# Patient Record
Sex: Female | Born: 1975
Health system: Southern US, Community
[De-identification: ages and names within clinical notes are randomized; demographics above are authoritative.]

## PROBLEM LIST (undated history)

## (undated) DIAGNOSIS — F419 Anxiety disorder, unspecified: Secondary | ICD-10-CM

## (undated) DIAGNOSIS — J45909 Unspecified asthma, uncomplicated: Secondary | ICD-10-CM

## (undated) DIAGNOSIS — O1490 Unspecified pre-eclampsia, unspecified trimester: Secondary | ICD-10-CM

## (undated) DIAGNOSIS — T7840XA Allergy, unspecified, initial encounter: Secondary | ICD-10-CM

## (undated) HISTORY — DX: Allergy, unspecified, initial encounter: T78.40XA

## (undated) HISTORY — DX: Unspecified pre-eclampsia, unspecified trimester: O14.90

## (undated) HISTORY — DX: Unspecified asthma, uncomplicated: J45.909

## (undated) HISTORY — DX: Anxiety disorder, unspecified: F41.9

---
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
## 2011-02-22 LAB — HM PAP SMEAR: HM PAP: NORMAL

## 2012-06-30 LAB — HM HIV SCREENING LAB: HM HIV Screening: NEGATIVE

## 2015-09-03 ENCOUNTER — Ambulatory Visit: Payer: Self-pay | Admitting: Family Medicine

## 2015-09-09 ENCOUNTER — Encounter: Payer: Self-pay | Admitting: Family Medicine

## 2015-09-12 ENCOUNTER — Ambulatory Visit: Payer: Self-pay | Admitting: Family Medicine

## 2015-10-24 ENCOUNTER — Ambulatory Visit: Payer: Self-pay | Admitting: Family Medicine

## 2017-11-10 ENCOUNTER — Encounter: Payer: Self-pay | Admitting: Nurse Practitioner

## 2017-11-10 ENCOUNTER — Ambulatory Visit (INDEPENDENT_AMBULATORY_CARE_PROVIDER_SITE_OTHER): Payer: Commercial Managed Care - PPO | Admitting: Nurse Practitioner

## 2017-11-10 ENCOUNTER — Other Ambulatory Visit: Payer: Self-pay

## 2017-11-10 VITALS — BP 133/80 | HR 92 | Temp 98.4°F | Ht 62.0 in | Wt 143.0 lb

## 2017-11-10 DIAGNOSIS — J0100 Acute maxillary sinusitis, unspecified: Secondary | ICD-10-CM

## 2017-11-10 DIAGNOSIS — Z7689 Persons encountering health services in other specified circumstances: Secondary | ICD-10-CM | POA: Diagnosis not present

## 2017-11-10 DIAGNOSIS — Z23 Encounter for immunization: Secondary | ICD-10-CM | POA: Diagnosis not present

## 2017-11-10 MED ORDER — IPRATROPIUM BROMIDE 0.06 % NA SOLN: 2.0000 | Freq: Four times a day (QID) | NASAL | 0 refills | Status: DC

## 2017-11-10 MED ORDER — AMOXICILLIN 500 MG PO TABS: 500.0000 mg | ORAL_TABLET | Freq: Two times a day (BID) | ORAL | 0 refills | Status: AC

## 2017-11-10 NOTE — Progress Notes (Signed)
Subjective:    Patient ID: Maureen Allen, female    DOB: [birth year], 41 y.o.   MRN: [id number]  Maureen Allen is a 41 y.o. female presenting on 11/10/2017 for Establish Care (nasal congestion, intermittent nasal drainage, sinus pressure mostly under eyes, headaches x 1 mth )   HPI  Establish Care New Provider [facility] [place] [facility].  [facility] for [facility] med refills.  [facility] Con[facility]yl Carroll, Midwife.   URI Pt reports URI symptoms for last 1 month.  SYmptoms include nasal congestion, rhinorhea, sinus congestion/pressure, upper jaw pain, headaches.  She has had chills occasionally as well as occasional left ear pressure.  - Pt denies fever, sweats, nausea, vomiting, diarrhea and constipation. - Nasal antihistamine Afrin uses only at night before bed x 3 days.  Asthma Advair once daily.  Albuterol 2-3x per week.  She has worse symptoms with allergies, but currently feels like her asthma is well controlled.   Past Medical History:  Diagnosis Date  . Allergy    oak leaves fall, blooming  . Anxiety    occasionally has panic attack due to large extra stressor  . Asthma   . Pre-eclampsia    Past Surgical History:  Procedure Laterality Date  . MOUTH SURGERY  1993   Social History   Socioeconomic History  . Marital status: Married    Spouse name: Not on file  . Number of children: Not on file  . Years of education: Not on file  . Highest education level: Not on file  Social Needs  . Financial resource strain: Not on file  . Food insecurity - worry: Not on file  . Food insecurity - inability: Not on file  . Transportation needs - medical: Not on file  . Transportation needs - non-medical: Not on file  Occupational History  . 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 Smoking status: Former Smoker    Last attempt to quit: 11/24/1999    Years since quitting: 18.0  . Smokeless tobacco: Never Used  Substance and Sexual  Activity  . Alcohol use: Yes    Alcohol/week: 0.6 oz    Types: 1 Standard drinks or equivalent per week  . Drug use: No  . Sexual activity: Yes    Birth control/protection: Surgical    Comment: partner vasectomy  Other Topics Concern  . Not on file  Social History Narrative  . Not on file   Family History  Adopted: Yes  Problem Relation Age of Onset  . Healthy Son    Current Outpatient Medications on File Prior to Visit  Medication Sig  . albuterol (PROVENTIL HFA;VENTOLIN HFA) 108 (90 BASE) MCG/ACT inhaler Inhale into the lungs every 6 (six) hours as needed for wheezing or shortness of breath.  . desogestrel-ethinyl estradiol (KARIVA) 0.15-0.02/0.01 MG (21/5) tablet Kariva (28) 0.15 mg-0.02 mg (21)/0.01 mg (5) tablet  TAKE 1 TABLET BY MOUTH ONCE DAILY.  Marland Kitchen. Fluticasone-Salmeterol (ADVAIR) 100-50 MCG/DOSE AEPB Inhale 1 puff into the lungs once.  . loratadine (CLARITIN) 10 MG tablet Take 10 mg by mouth daily.   No current facility-administered medications on file prior to visit.     Review of Systems  Constitutional: Positive for chills.  HENT: Positive for congestion, ear pain, postnasal drip, rhinorrhea, sinus pressure and sinus pain.   Eyes: Negative.   Respiratory: Negative.   Cardiovascular: Negative.   Gastrointestinal: Negative.   Endocrine: Negative.   Genitourinary: Negative.  Musculoskeletal: Negative.   Skin: Negative.   Allergic/Immunologic: Negative.   Neurological: Positive for headaches.  Hematological: Negative.   Psychiatric/Behavioral: Negative.    Per HPI unless specifically indicated above     Objective:    BP 133/80 (BP Location: Right Arm, Patient Position: Sitting, Cuff Size: Normal)   Pulse 92   Temp 98.4 F (36.9 C) (Oral)   Ht 5\' 2"  (1.575 m)   Wt 143 lb (64.9 kg)   LMP 10/11/2017 (Approximate)   BMI 26.16 kg/m   Wt Readings from Last 3 Encounters:  11/10/17 143 lb (64.9 kg)    Physical Exam  General - overweight, well-appearing,  NAD HEENT - Normocephalic, atraumatic, PERRL, EOMI, edematous nares w/ rhinorrhea, oropharynx clear, MMM, TMnormal, Ear canal normal, external ear normal w/o lesions, frontal and maxillary sinuses tender to palpation. Neck - supple, non-tender, no cervical LAD Heart - RRR, no murmurs heard Lungs - Clear throughout all lobes, no wheezing, crackles, or rhonchi. Normal work of breathing. Extremeties - non-tender, no edema, cap refill < 2 seconds, peripheral pulses intact +2 bilaterally Skin - warm, dry, no rashes Neuro - awake, alert, oriented x3, normal gait Psych - Normal mood and affect, normal behavior   Results for orders placed or performed in visit on 09/09/15  HM PAP SMEAR  Result Value Ref Range   HM Pap smear Normal       Assessment & Plan:   Problem List Items Addressed This Visit    None    Visit Diagnoses    Needs flu shot     Pt age < 18.  Desires flu shot today despite symptoms.  Ok to administer.  Place quadrivalent flu vaccine today.   Relevant Orders   Flu Vaccine QUAD 6+ mos PF IM (Fluarix Quad PF) (Completed)   Subacute maxillary sinusitis    -  Primary Consistent with URI and secondary sinusitis with symptoms worsening over the past 7 days and initial symptoms of nasal congestion and sinus pressure over 2 weeks ago.   Plan: 1.START taking amoxicillin 500 mg tablets every 12 hours for 10 days.  Discussed completing antibiotic. - While on antibiotic, take a probiotic OTC or from food. - Start Atrovent nasal spray decongestant 2 sprays each nostril up to 4 times daily for 5-7 days - Continue anti-histamine loratadine 10mg  daily. - Can use Flonase 2 sprays each nostril daily for up to 4-6 weeks if no epistaxis. - Start Mucinex-DM OTC for  7-10 days prn congestion 2. Supportive care with nasal saline, warm herbal tea with honey, 3. Improve hydration 4. Tylenol / Motrin PRN fevers  5. Return criteria given    Encounter to establish care     Previous PCP was at  family doctor in [place].  Records will be requested.  Past medical, family, and surgical history reviewed w/ pt.       Meds ordered this encounter  Medications  . amoxicillin (AMOXIL) 500 MG tablet    Sig: Take 1 tablet (500 mg total) by mouth 2 (two) times daily for 10 days.    Dispense:  20 tablet    Refill:  0    Order Specific Question:   Supervising Provider    Answer:   Smitty Cords [[id number]]  . ipratropium (ATROVENT) 0.06 % nasal spray    Sig: Place 2 sprays into both nostrils 4 (four) times daily for 7 days.    Dispense:  15 mL    Refill:  0  Order Specific Question:   Supervising Provider    Answer:   Smitty CordsKARAMALEGOS, ALEXANDER J [[id number]]     Follow up plan: Return in about 8 months (around 07/11/2018) for annual physical AND as needed if symptoms worsen or do not improve.  Wilhelmina McardleLauren Nyaisha Simao, DNP, AGPCNP-BC Adult Gerontology Primary Care Nurse Practitioner [facility] 11/21/2017, 7:50 PM

## 2017-11-10 NOTE — Patient Instructions (Addendum)
Maureen Allen, Thank you for coming in to clinic today.  1. It sounds like you have an Upper Respiratory Bacterial infection.  Recommend good hand washing. - START taking amoxicillin 500 mg twice daily for 10 days.  Make sure to take all doses of your antibiotic. - While you are on an antibiotic, take a probiotic.  Antibiotics kill good and bad bacteria.  A probiotic helps to replace your good bacteria. Probiotic pills can be found over the counter.  One brand is Florastor, but you can use any brand you prefer.  You can also get good bacteria from foods.  These foods are yogurt, kefir, kombucha, and fresh, refrigerated and uncooked sauerkraut. - Drink plenty of fluids.  - Start Atrovent nasal spray decongestant 2 sprays each nostril up to 4 times daily for 5-7 days - Continue anti-histamine loratadine 10mg  daily. - You may also use Flonase 2 sprays each nostril daily for up to 4-6 weeks, especially for increased ear pressure.  Other over the counter medications you may try, if needed for symptoms are: - If congestion is worse, start OTC Mucinex (or may try Mucinex-DM for cough) up to 7-10 days then stop - You may try over the counter Nasal Saline spray (Simply Saline, Ocean Spray) as needed to reduce congestion. - Start taking Tylenol extra strength 1 to 2 tablets every 6-8 hours for aches or fever/chills for next few days as needed.  Do not take more than 3,000 mg in 24 hours from all medicines.  You may also take ibuprofen 200-400mg  every 8 hours as needed.   - Drink warm herbal tea with honey for sore throat.   If symptoms are significantly worse with persistent fevers/chills despite tylenol/ibpurofen, nausea, vomiting unable to tolerate food/fluids or medicine, body aches, or shortness of breath, sinus pain pressure or worsening productive cough, then follow-up for re-evaluation, may seek more immediate care at Urgent Care or the ED if you are more concerned that it is an emergency.   Please schedule  a follow-up appointment with Maureen Allen, Maureen Allen. Return in about 8 months (around 07/11/2018) for annual physical AND as needed if symptoms worsen or do not improve.  If you have any other questions or concerns, please feel free to call the clinic or send a message through MyChart. You may also schedule an earlier appointment if necessary.  You will receive a survey after today's visit either digitally by e-mail or paper by [employer] mail. Your experiences and feedback matter to us.  Please respond so we know how we are doing as we provide care for you.   Maureen McardleLauren Keierra Nudo, Maureen Allen, Maureen Allen Adult Gerontology Nurse Practitioner [facility], [facility]

## 2017-11-21 ENCOUNTER — Encounter: Payer: Self-pay | Admitting: Nurse Practitioner

## 2017-11-30 ENCOUNTER — Encounter: Payer: Self-pay | Admitting: Nurse Practitioner

## 2017-11-30 ENCOUNTER — Other Ambulatory Visit: Payer: Self-pay

## 2017-11-30 ENCOUNTER — Ambulatory Visit (INDEPENDENT_AMBULATORY_CARE_PROVIDER_SITE_OTHER): Payer: Commercial Managed Care - PPO | Admitting: Nurse Practitioner

## 2017-11-30 VITALS — BP 122/74 | HR 90 | Temp 98.5°F | Resp 18 | Ht 62.0 in | Wt 143.0 lb

## 2017-11-30 DIAGNOSIS — J0141 Acute recurrent pansinusitis: Secondary | ICD-10-CM | POA: Diagnosis not present

## 2017-11-30 MED ORDER — AMOXICILLIN-POT CLAVULANATE ER 1000-62.5 MG PO TB12: 2.0000 | ORAL_TABLET | Freq: Two times a day (BID) | ORAL | 0 refills | Status: AC

## 2017-11-30 NOTE — Progress Notes (Signed)
Subjective:    Patient ID: Maureen Allen, female    DOB: [birth year], 42 y.o.   MRN: [id number]  Maureen Allen is a 42 y.o. female presenting on 11/30/2017 for Recurrent Sinusitis (Sinus pressure above the eyes, mild cough and runny nose w/ yellowish nasal drainage. pt states the symptoms improved some, but return after she stop abx. )   HPI Sinusitis Treated on 11/10/17 - Initial improvement after 3-7 days, but is having symptoms return several days after initial treatment.  She finished antibiotics on Friday and had symptoms return on Wednesday.  She has had sinus pressure, cough, runny nose and yellow mucus again.  Occasional chills, but no other symptoms. - She is still taking Claritin and Mucinex.  Social History   Tobacco Use  . Smoking status: Former Smoker    Last attempt to quit: 11/24/1999    Years since quitting: 18.0  . Smokeless tobacco: Never Used  Substance Use Topics  . Alcohol use: Yes    Alcohol/week: 0.6 oz    Types: 1 Standard drinks or equivalent per week  . Drug use: No    Review of Systems Per HPI unless specifically indicated above     Objective:    BP 122/74 (BP Location: Right Arm, Patient Position: Sitting, Cuff Size: Normal)   Pulse 90   Temp 98.5 F (36.9 C) (Oral)   Resp 18   Ht 5\' 2"  (1.575 m)   Wt 143 lb (64.9 kg)   BMI 26.16 kg/m   Wt Readings from Last 3 Encounters:  11/30/17 143 lb (64.9 kg)  11/10/17 143 lb (64.9 kg)    Physical Exam  General - overweight, well-appearing, NAD HEENT - Normocephalic, atraumatic, PERRL, EOMI, edematous nares w/ rhinorrhea, oropharynx clear, MMM, TMnormal, Ear canal normal, external ear normal w/o lesions, maxillary sinuses tender to palpation. Frontal sinus nontender. Neck - supple, non-tender, no cervical LAD Heart - RRR, no murmurs heard Lungs - Clear throughout all lobes, no wheezing, crackles, or rhonchi. Normal work of breathing. Extremeties - non-tender, no edema, cap refill < 2 seconds, peripheral pulses  intact +2 bilaterally Skin - warm, dry, no rashes Neuro - awake, alert, oriented x3, normal gait Psych - Normal mood and affect, normal behavior       Assessment & Plan:   Problem List Items Addressed This Visit    None    Visit Diagnoses    Acute recurrent pansinusitis    -  Primary   Relevant Medications   amoxicillin-clavulanate (AUGMENTIN XR) 1000-62.5 MG 12 hr tablet    .START taking Augmentin XR 1,000-62.5 mg two tablets 12 hours for 10 days.  Discussed completing antibiotic. - While on antibiotic, take a probiotic OTC or from food. - Start Atrovent nasal spray decongestant 2 sprays each nostril up to 4 times daily for 5-7 days - Continue anti-histamine loratadine 10mg  daily. - Can use Flonase 2 sprays each nostril daily for up to 4-6 weeks if no epistaxis. - Start Mucinex-DM OTC for  7-10 days prn congestion 2. Supportive care with nasal saline, warm herbal tea with honey, 3. Improve hydration 4. Tylenol / Motrin PRN fevers  5. Return criteria given  Meds ordered this encounter  Medications  . amoxicillin-clavulanate (AUGMENTIN XR) 1000-62.5 MG 12 hr tablet    Sig: Take 2 tablets by mouth 2 (two) times daily for 10 days.    Dispense:  40 tablet    Refill:  0    Order Specific Question:   Supervising Provider  Answer:   Smitty CordsKARAMALEGOS, ALEXANDER J [[id number]]      Follow up plan: Return 5-7 days if symptoms worsen or fail to improve.   Maureen McardleLauren Chick Cousins, DNP, AGPCNP-BC Adult Gerontology Primary Care Nurse Practitioner [facility] 11/30/2017, 5:05 PM

## 2017-11-30 NOTE — Patient Instructions (Addendum)
Maureen Allen, Thank you for coming in to clinic today.  1. Take Augmentin XR 1000 mg two tablets twice daily for 10 days.  2. If you feel like it is still hanging on before done with antibiotics, call clinic for prednisone.  Continue OTC meds as previously instructed and use backup birth control method.  Please schedule a follow-up appointment with Wilhelmina McardleLauren Rache Klimaszewski, AGNP. Return 5-7 days if symptoms worsen or fail to improve.  If you have any other questions or concerns, please feel free to call the clinic or send a message through MyChart. You may also schedule an earlier appointment if necessary.  You will receive a survey after today's visit either digitally by e-mail or paper by [employer] mail. Your experiences and feedback matter to us.  Please respond so we know how we are doing as we provide care for you.   Wilhelmina McardleLauren Rykker Coviello, DNP, AGNP-BC Adult Gerontology Nurse Practitioner [facility], [facility]

## 2018-01-17 ENCOUNTER — Encounter: Payer: Self-pay | Admitting: Nurse Practitioner

## 2018-01-17 ENCOUNTER — Other Ambulatory Visit: Payer: Self-pay

## 2018-01-17 ENCOUNTER — Ambulatory Visit (INDEPENDENT_AMBULATORY_CARE_PROVIDER_SITE_OTHER): Payer: Commercial Managed Care - PPO | Admitting: Nurse Practitioner

## 2018-01-17 VITALS — BP 144/82 | HR 99 | Ht 62.0 in | Wt 141.2 lb

## 2018-01-17 DIAGNOSIS — F419 Anxiety disorder, unspecified: Secondary | ICD-10-CM

## 2018-01-17 DIAGNOSIS — F4321 Adjustment disorder with depressed mood: Secondary | ICD-10-CM

## 2018-01-17 MED ORDER — PAROXETINE HCL 20 MG PO TABS: ORAL_TABLET | ORAL | 5 refills | Status: DC

## 2018-01-17 MED ORDER — HYDROXYZINE HCL 25 MG PO TABS: ORAL_TABLET | ORAL | 1 refills | Status: DC

## 2018-01-17 NOTE — Patient Instructions (Addendum)
Maureen Allen, Thank you for coming in to clinic today.  1. Start hydralazine 25 mg tablet .  Take 1/2 - 1 tablet during day for anxiety.  Take 1-2 tablets at beditme for anxiety and sleep.  2. Start paroxetine 20 mg once daily for long-term anxiety control.  Please schedule a follow-up appointment with Wilhelmina McardleLauren Binnie Vonderhaar, AGNP. Return in about 6 weeks (around 02/28/2018) for anxiety.  If you have any other questions or concerns, please feel free to call the clinic or send a message through MyChart. You may also schedule an earlier appointment if necessary.  You will receive a survey after today's visit either digitally by e-mail or paper by [employer] mail. Your experiences and feedback matter to us.  Please respond so we know how we are doing as we provide care for you.   Wilhelmina McardleLauren Deneen Slager, DNP, AGNP-BC Adult Gerontology Nurse Practitioner [facility], [facility]

## 2018-01-17 NOTE — Progress Notes (Signed)
Subjective:    Patient ID: Maureen Allen, female    DOB: [birth year], 42 y.o.   MRN: [id number]  Maureen Lamasnna Kraemer is a 42 y.o. female presenting on 01/17/2018 for Anxiety (pt recently had deaf in her family. Her mother passed away x 5 days ago. )   HPI Grief/Anxiety Pt presents today to obtain pharmacologic treatment of increased anxiety and overwhelming sadness.  Pt has not yet had panic attack, but does still have overwhelming waves of emotions of sadness, anxiety after her mother's death 5 days ago. Has fear of anxiety attack when she is with her son or when she returns to work after her mother's passing.  She reports taking a 1/2 Xanax from a family member during the funeral.  She cites this was not healthy practice, but felt she had no other choice at that time.  Pt is an only child and has already had her father die.  She reports having great extended family support and support from her husband and child. - Pt also reports significant insomnia with difficulty going to sleep, awakening in middle of the night.  - Pt also has questions about how to dispose safely of medications her mother was taking. Med list reviewed.  Instructed pt to take valium to any law enforcement office for drug take back.  Most pharmacies may also do any drug take back for safe disposal.     Dep[facility] screen [facility] 01/17/2018 11/10/2017  Decreased Interest 0 0  Down, Depressed, Hopeless 0 0  PHQ - 2 Score 0 0  Altered sleeping 1 0  Tired, decreased energy 0 1  Change in appetite 1 0  Feeling bad or failure about yourself  0 0  Trouble concentrating 1 0  Moving slowly or fidgety/restless 1 0  Suicidal thoughts 0 0  PHQ-9 Score 4 1  Difficult doing work/chores Very difficult Not difficult at all   GAD 7 : Generalized Anxiety Score 01/17/2018 11/10/2017  Nervous, Anxious, on Edge 1 0  Control/stop worrying 1 0  Worry too much - different things 1 0  Trouble relaxing 1 0  Restless 1 0  Easily annoyed or irritable 1 1    Afraid - awful might happen 1 0  Total GAD 7 Score 7 1  Anxiety Difficulty Extremely difficult Not difficult at all    Social History   Tobacco Use  . Smoking status: Former Smoker    Last attempt to quit: 11/24/1999    Years since quitting: 18.1  . Smokeless tobacco: Never Used  Substance Use Topics  . Alcohol use: Yes    Alcohol/week: 0.6 oz    Types: 1 Standard drinks or equivalent per week  . Drug use: No    Review of Systems Per HPI unless specifically indicated above     Objective:    BP (!) 140/101 (BP Location: Right Arm, Patient Position: Sitting, Cuff Size: Normal)   Pulse 99   Ht 5\' 2"  (1.575 m)   Wt 141 lb 3.2 oz (64 kg)   BMI 25.83 kg/m    BP recheck: 144/82  Wt Readings from Last 3 Encounters:  01/17/18 141 lb 3.2 oz (64 kg)  11/30/17 143 lb (64.9 kg)  11/10/17 143 lb (64.9 kg)    Physical Exam  General - healthy weight, well-appearing, NAD HEENT - Normocephalic, atraumatic Neck - supple, non-tender, no LAD, no thyromegaly, no carotid bruit Heart - RRR, no murmurs heard Lungs - Clear throughout all lobes, no wheezing, crackles, or  rhonchi. Normal work of breathing. Extremeties - non-tender, no edema, cap refill < 2 seconds, peripheral pulses intact +2 bilaterally Skin - warm, dry Neuro - awake, alert, oriented x3, normal gait Psych - Normal mood and affect at times.  Often tearful, somewhat anxious mood in response to grief and memories. Otherwise normal behavior and is participating well in conversation.  Results for orders placed or performed in visit on 09/09/15  HM PAP SMEAR  Result Value Ref Range   HM Pap smear Normal       Assessment & Plan:   Problem List Items Addressed This Visit    None    Visit Diagnoses    Grief    -  Primary   Relevant Medications   hydrOXYzine (ATARAX/VISTARIL) 25 MG tablet   PARoxetine (PAXIL) 20 MG tablet   Anxiety       Relevant Medications   hydrOXYzine (ATARAX/VISTARIL) 25 MG tablet   PARoxetine  (PAXIL) 20 MG tablet      Currently healthy grief process, but pt having difficulty with symptoms of anxiety and insomnia.  Pt with good support system and healthy response to date.  Denies SI/HI and has no plans to carry out.  Plan: 1. Discussed to continue relying on social support and family support. 2. START hydroxyzine 25 mg tablet.  Take 1/2-1 tablet twice daily as needed for anxiety.  Take 1-2 tablets as needed at bedtime for anxiety and sleep. 3. START paroxetine 20 mg once daily for anxiety.  Take 1/2 tablet for 7 days, then increase to 1 tablet daily.  Reviewed common GI side effects and possiblity for decreased libido. 4. Followup 6 weeks.  Meds ordered this encounter  Medications  . hydrOXYzine (ATARAX/VISTARIL) 25 MG tablet    Sig: Take 1/2 - 1 tablet twice during day as needed for anxiety and take 1-2 tablets at bedtime for anxiety and sleep.    Dispense:  120 tablet    Refill:  1    Order Specific Question:   Supervising Provider    Answer:   Smitty Cords [[id number]]  . PARoxetine (PAXIL) 20 MG tablet    Sig: For first 7 days, take 1/2 (10 mg) tablet once daily.  Then, take 1 full tablet (20 mg) and continue.    Dispense:  30 tablet    Refill:  5    Order Specific Question:   Supervising Provider    Answer:   Smitty Cords [[id number]]    Follow up plan: Return in about 6 weeks (around 02/28/2018) for anxiety.  Wilhelmina Mcardle, DNP, AGPCNP-BC Adult Gerontology Primary Care Nurse Practitioner [facility] 01/17/2018, 10:44 AM

## 2018-02-09 ENCOUNTER — Telehealth: Payer: Self-pay

## 2018-02-09 ENCOUNTER — Other Ambulatory Visit: Payer: Self-pay

## 2018-02-09 DIAGNOSIS — F4321 Adjustment disorder with depressed mood: Secondary | ICD-10-CM

## 2018-02-09 DIAGNOSIS — F419 Anxiety disorder, unspecified: Secondary | ICD-10-CM

## 2018-02-09 MED ORDER — PAROXETINE HCL 20 MG PO TABS: ORAL_TABLET | ORAL | 1 refills | Status: DC

## 2018-02-09 MED ORDER — HYDROXYZINE HCL 25 MG PO TABS: ORAL_TABLET | ORAL | 1 refills | Status: DC

## 2018-02-09 NOTE — Telephone Encounter (Signed)
90 Days Supply, request for authorization.

## 2018-02-17 NOTE — Telephone Encounter (Signed)
error 

## 2018-02-28 ENCOUNTER — Encounter: Payer: Self-pay | Admitting: Nurse Practitioner

## 2018-02-28 ENCOUNTER — Other Ambulatory Visit: Payer: Self-pay

## 2018-02-28 ENCOUNTER — Ambulatory Visit (INDEPENDENT_AMBULATORY_CARE_PROVIDER_SITE_OTHER): Payer: Commercial Managed Care - PPO | Admitting: Nurse Practitioner

## 2018-02-28 VITALS — BP 122/74 | HR 81 | Temp 98.6°F | Ht 62.0 in | Wt 140.2 lb

## 2018-02-28 DIAGNOSIS — F419 Anxiety disorder, unspecified: Secondary | ICD-10-CM

## 2018-02-28 DIAGNOSIS — F4321 Adjustment disorder with depressed mood: Secondary | ICD-10-CM | POA: Diagnosis not present

## 2018-02-28 NOTE — Patient Instructions (Addendum)
Maureen Allen,   Thank you for coming in to clinic today.  1. Continue medication without changes.    Consider Grief counseling: - [facility] [place] [facility] [place]  [facility] [address]  [place], [place]  [id number]   General Information Phone: [phone]; Toll-free: [phone]  Please schedule a follow-up appointment with Wilhelmina McardleLauren Khyre Germond, AGNP. Return in about 6 weeks (around 04/11/2018) for anxiety and grief.   If you have any other questions or concerns, please feel free to call the clinic or send a message through MyChart. You may also schedule an earlier appointment if necessary.  You will receive a survey after today's visit either digitally by e-mail or paper by [employer] mail. Your experiences and feedback matter to us.  Please respond so we know how we are doing as we provide care for you.  Wilhelmina McardleLauren Merian Wroe, DNP, AGNP-BC Adult Gerontology Nurse Practitioner [facility], [facility]

## 2018-02-28 NOTE — Progress Notes (Signed)
Subjective:    Patient ID: Maureen Allen, female    DOB: [birth year], 42 y.o.   MRN: [id number]  Maureen Allen is a 42 y.o. female presenting on 02/28/2018 for Anxiety   HPI Anxiety End of the day, activities at home are now most stressful and hardest still.  Particularly, the chaos of getting dinner prepared and 71-year-old to sleep.  She notes "no patience" at end of the day.  - AM and readiness to go is still going well. - Having some trouble falling asleep.  Nighttime mind still wanders at bedtime. - Sometimes is more in tense muscles  - had raw cheeks and tongue initially from grinding/irritation/muscle clenching, which have improved. - Most bothersome task related to grieving/loss of her mother at this point are executor of estate stressors.   - Continues to have lots of social support from husband and her mother's extended family.   GAD 7 : Generalized Anxiety Score 02/28/2018 01/17/2018 11/10/2017  Nervous, Anxious, on Edge 2 1 0  Control/stop worrying 1 1 0  Worry too much - different things 1 1 0  Trouble relaxing 0 1 0  Restless 0 1 0  Easily annoyed or irritable 2 1 1   Afraid - awful might happen 1 1 0  Total GAD 7 Score 7 7 1   Anxiety Difficulty Somewhat difficult Extremely difficult Not difficult at all   Depression screen [facility] 2/9 02/28/2018 01/17/2018 11/10/2017  Decreased Interest 1 0 0  Down, Depressed, Hopeless 0 0 0  PHQ - 2 Score 1 0 0  Altered sleeping 0 1 0  Tired, decreased energy 0 0 1  Change in appetite 0 1 0  Feeling bad or failure about yourself  0 0 0  Trouble concentrating 1 1 0  Moving slowly or fidgety/restless 0 1 0  Suicidal thoughts 0 0 0  PHQ-9 Score 2 4 1   Difficult doing work/chores Somewhat difficult Very difficult Not difficult at all     Social History   Tobacco Use  . Smoking status: Former Smoker    Last attempt to quit: 11/24/1999    Years since quitting: 18.3  . Smokeless tobacco: Never Used  Substance Use Topics  . Alcohol use: Yes   Alcohol/week: 0.6 oz    Types: 1 Standard drinks or equivalent per week  . Drug use: No    Review of Systems Per HPI unless specifically indicated above     Objective:    BP 122/74 (BP Location: Right Arm, Patient Position: Sitting, Cuff Size: Normal)   Pulse 81   Temp 98.6 F (37 C) (Oral)   Ht 5\' 2"  (1.575 m)   Wt 140 lb 3.2 oz (63.6 kg)   BMI 25.64 kg/m   Wt Readings from Last 3 Encounters:  02/28/18 140 lb 3.2 oz (63.6 kg)  01/17/18 141 lb 3.2 oz (64 kg)  11/30/17 143 lb (64.9 kg)    Physical Exam  Constitutional: She is oriented to person, place, and time. She appears well-developed and well-nourished. No distress.  HENT:  Head: Normocephalic and atraumatic.  Cardiovascular: Normal rate, regular rhythm, S1 normal, S2 normal, normal heart sounds and intact distal pulses.  Pulmonary/Chest: Effort normal and breath sounds normal. No respiratory distress.  Neurological: She is alert and oriented to person, place, and time.  Skin: Skin is warm and dry.  Psychiatric: She has a normal mood and affect. Her behavior is normal. Judgment and thought content normal.  Vitals reviewed.  Results for orders placed or  performed in visit on 09/09/15  HM PAP SMEAR  Result Value Ref Range   HM Pap smear Normal       Assessment & Plan:   Problem List Items Addressed This Visit      Other   Anxiety - Primary    Other Visit Diagnoses    Grief        Pt continues to have anxiety and occasional sadness for grief process of losing her mother.  She is improving on medications hydroxyzine and paroxetine.  Is not noticing any significant side effects.  Plan: 1. Continue medication without changes.   2. Consider Grief counseling Provided information for [facility] [place] [facility] [place]  3. Followup 6 weeks  Follow up plan: Return in about 6 weeks (around 04/11/2018) for anxiety and grief.  Wilhelmina McardleLauren Emrick Hensch, DNP, AGPCNP-BC Adult Gerontology Primary Care Nurse  Practitioner [facility] 03/23/2018, 9:01 PM

## 2018-03-23 ENCOUNTER — Encounter: Payer: Self-pay | Admitting: Nurse Practitioner

## 2018-03-23 DIAGNOSIS — F419 Anxiety disorder, unspecified: Secondary | ICD-10-CM | POA: Insufficient documentation

## 2018-05-25 ENCOUNTER — Telehealth: Payer: Self-pay | Admitting: Nurse Practitioner

## 2018-05-25 DIAGNOSIS — J4520 Mild intermittent asthma, uncomplicated: Secondary | ICD-10-CM

## 2018-05-25 MED ORDER — ALBUTEROL SULFATE HFA 108 (90 BASE) MCG/ACT IN AERS: 1.0000 | INHALATION_SPRAY | Freq: Four times a day (QID) | RESPIRATORY_TRACT | 2 refills | Status: DC | PRN

## 2018-05-25 NOTE — Telephone Encounter (Signed)
Pt called requesting refill on [employer]  [facility]  called into  [facility]  [address]   [place]

## 2018-08-10 ENCOUNTER — Other Ambulatory Visit: Payer: Self-pay | Admitting: Nurse Practitioner

## 2018-08-10 DIAGNOSIS — F419 Anxiety disorder, unspecified: Secondary | ICD-10-CM

## 2018-08-10 DIAGNOSIS — F4321 Adjustment disorder with depressed mood: Secondary | ICD-10-CM

## 2018-08-11 ENCOUNTER — Other Ambulatory Visit: Payer: Self-pay | Admitting: Nurse Practitioner

## 2018-08-11 DIAGNOSIS — J4520 Mild intermittent asthma, uncomplicated: Secondary | ICD-10-CM

## 2018-09-20 ENCOUNTER — Other Ambulatory Visit: Payer: Self-pay

## 2018-09-20 ENCOUNTER — Encounter: Payer: Self-pay | Admitting: Nurse Practitioner

## 2018-09-20 ENCOUNTER — Ambulatory Visit (INDEPENDENT_AMBULATORY_CARE_PROVIDER_SITE_OTHER): Payer: Commercial Managed Care - PPO | Admitting: Nurse Practitioner

## 2018-09-20 VITALS — BP 132/80 | HR 72 | Temp 98.3°F | Ht 62.0 in | Wt 140.8 lb

## 2018-09-20 DIAGNOSIS — Z23 Encounter for immunization: Secondary | ICD-10-CM

## 2018-09-20 DIAGNOSIS — Z1239 Encounter for other screening for malignant neoplasm of breast: Secondary | ICD-10-CM | POA: Diagnosis not present

## 2018-09-20 DIAGNOSIS — Z0000 Encounter for general adult medical examination without abnormal findings: Secondary | ICD-10-CM

## 2018-09-20 NOTE — Patient Instructions (Addendum)
Maureen Allen,   Thank you for coming in to clinic today.  1. Keep up your work for healthy eating and regular activity.  2. Your mammogram order has been placed.  Call the Scheduling phone number at [phone] to schedule your mammogram at your convenience.  You can choose to go to either location listed below.  Let the scheduler know which location you prefer.  [facility]  [facility]  [address]  [place], [place] [address]   [facility] [address] [place], [place] [address]   3. Keep your GYN appointment for your Pap smear.  Please schedule a follow-up appointment with Wilhelmina Mcardle, AGNP. Return in about 1 year (around 09/21/2019) for annual physical.  If you have any other questions or concerns, please feel free to call the clinic or send a message through MyChart. You may also schedule an earlier appointment if necessary.  You will receive a survey after today's visit either digitally by e-mail or paper by [employer]. Your experiences and feedback matter to [place].  Please respond so we know how we are doing as we provide care for you.   Wilhelmina Mcardle, DNP, AGNP-BC Adult Gerontology Nurse Practitioner [facility], [facility]

## 2018-09-20 NOTE — Progress Notes (Signed)
Subjective:    Patient ID: Maureen Allen, female    DOB: 1976-05-25, 42 y.o.   MRN: [id number]  Maureen Allen is a 42 y.o. female presenting on 09/20/2018 for Annual Exam   HPI Annual Physical Exam Patient has been feeling well.  They have no acute concerns today. Sleeps approx 7 hours per night uninterrupted.  Continues to do well with mother's death on Paxil.  Also has had testicular cancer diagnosis in her husband this past summer, but with good outcome.  HEALTH MAINTENANCE: Weight/BMI: stable Physical activity: not regularly, but is starting doing ballet/stretching and hiking Diet: working on improving, has been eating more meals out/fried food. Seatbelt: always Sunscreen: sometimes, more on face with cosmetics PAP: due - will have done at GYN Mammogram:  due HIV: neg with prior pregnancy Optometry: regular Dentistry: regular  VACCINES: Tetanus: due today Influenza: due today  Past Medical History:  Diagnosis Date  . Allergy    oak leaves fall, blooming  . Anxiety    occasionally has panic attack due to large extra stressor  . Asthma   . Pre-eclampsia    Past Surgical History:  Procedure Laterality Date  . MOUTH SURGERY  1993   Social History   Socioeconomic History  . Marital status: Married    Spouse name: Quianna Avery  . Number of children: Not on file  . Years of education: Not on file  . Highest education level: Not on file  Occupational History  . 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 Financial resource strain: Not on file  . Food insecurity:    Worry: Not on file    Inability: Not on file  . Transportation needs:    Medical: Not on file    Non-medical: Not on file  Tobacco Use  . Smoking status: Former Smoker    Last attempt to quit: 11/24/1999    Years since quitting: 18.8  . Smokeless tobacco: Never Used  Substance and Sexual Activity  . Alcohol use: Yes    Alcohol/week: 1.0 standard drinks    Types: 1 Standard drinks or equivalent  per week  . Drug use: No  . Sexual activity: Yes    Birth control/protection: Surgical    Comment: partner vasectomy  Lifestyle  . Physical activity:    Days per week: Not on file    Minutes per session: Not on file  . Stress: Not on file  Relationships  . Social connections:    Talks on phone: Not on file    Gets together: Not on file    Attends religious service: Not on file    Active member of club or organization: Not on file    Attends meetings of clubs or organizations: Not on file    Relationship status: Not on file  . Intimate partner violence:    Fear of current or ex partner: Not on file    Emotionally abused: Not on file    Physically abused: Not on file    Forced sexual activity: Not on file  Other Topics Concern  . Not on file  Social History Narrative  . Not on file   Family History  Adopted: Yes  Problem Relation Age of Onset  . Healthy Son    Current Outpatient Medications on File Prior to Visit  Medication Sig  . albuterol (PROVENTIL HFA;VENTOLIN HFA) 108 (90 Base) MCG/ACT inhaler INHALE 1-2 PUFFS INTO THE LUNGS EVERY 6 (SIX) HOURS AS NEEDED FOR WHEEZING OR SHORTNESS  OF BREATH.  Marland Kitchen Fluticasone-Salmeterol (ADVAIR) 100-50 MCG/DOSE AEPB Inhale 1 puff into the lungs once.  . loratadine (CLARITIN) 10 MG tablet Take 10 mg by mouth daily.  Marland Kitchen PARoxetine (PAXIL) 20 MG tablet FOR FIRST 7 DAYS, TAKE 1/2 (10 MG) TABLET ONCE DAILY. THEN, TAKE 1 FULL TABLET (20 MG) AND CONTINUE.  Marland Kitchen desogestrel-ethinyl estradiol (KARIVA) 0.15-0.02/0.01 MG (21/5) tablet Kariva (28) 0.15 mg-0.02 mg (21)/0.01 mg (5) tablet  TAKE 1 TABLET BY MOUTH ONCE DAILY.  Marland Kitchen ipratropium (ATROVENT) 0.06 % nasal spray Place 2 sprays into both nostrils 4 (four) times daily for 7 days.   No current facility-administered medications on file prior to visit.     Review of Systems  Constitutional: Negative for chills and fever.  HENT: Negative for congestion and sore throat.   Eyes: Negative for pain.    Respiratory: Negative for cough, shortness of breath and wheezing.   Cardiovascular: Negative for chest pain, palpitations and leg swelling.  Gastrointestinal: Negative for abdominal pain, blood in stool, constipation, diarrhea, nausea and vomiting.  Endocrine: Negative for polydipsia.  Genitourinary: Negative for dysuria, frequency, hematuria and urgency.  Musculoskeletal: Negative for back pain, myalgias and neck pain.  Skin: Negative.  Negative for rash.  Allergic/Immunologic: Negative for environmental allergies.  Neurological: Negative for dizziness, weakness and headaches.  Hematological: Does not bruise/bleed easily.  Psychiatric/Behavioral: Negative for dysphoric mood and suicidal ideas. The patient is not nervous/anxious.    Per HPI unless specifically indicated above     Objective:    BP 132/80   Pulse 72   Temp 98.3 F (36.8 C) (Oral)   Ht 5\' 2"  (1.575 m)   Wt 140 lb 12.8 oz (63.9 kg)   LMP 09/17/2018   BMI 25.75 kg/m   Wt Readings from Last 3 Encounters:  09/20/18 140 lb 12.8 oz (63.9 kg)  02/28/18 140 lb 3.2 oz (63.6 kg)  01/17/18 141 lb 3.2 oz (64 kg)    Physical Exam  Constitutional: She is oriented to person, place, and time. She appears well-developed and well-nourished. No distress.  HENT:  Head: Normocephalic and atraumatic.  Right Ear: External ear normal.  Left Ear: External ear normal.  Nose: Nose normal.  Mouth/Throat: Oropharynx is clear and moist.  Eyes: Pupils are equal, round, and reactive to light. Conjunctivae are normal.  Neck: Normal range of motion. Neck supple. No JVD present. No tracheal deviation present. No thyromegaly present.  Cardiovascular: Normal rate, regular rhythm, normal heart sounds and intact distal pulses. Exam reveals no gallop and no friction rub.  No murmur heard. Pulmonary/Chest: Effort normal and breath sounds normal. No respiratory distress.  Abdominal: Soft. Bowel sounds are normal. She exhibits no distension. There  is no hepatosplenomegaly. There is no tenderness.  Genitourinary:  Genitourinary Comments: Patient deferred to GYN  Musculoskeletal: Normal range of motion.  Lymphadenopathy:    She has no cervical adenopathy.  Neurological: She is alert and oriented to person, place, and time. No cranial nerve deficit.  Skin: Skin is warm and dry.  Psychiatric: She has a normal mood and affect. Her behavior is normal. Judgment and thought content normal.  Nursing note and vitals reviewed.   Results for orders placed or performed in visit on 09/09/15  HM PAP SMEAR  Result Value Ref Range   HM Pap smear Normal       Assessment & Plan:   Problem List Items Addressed This Visit    None    Visit Diagnoses    Encounter for  annual physical exam    -  Primary   Relevant Orders   CBC with Differential/Platelet   COMPLETE METABOLIC PANEL WITH GFR   Hemoglobin A1c   Lipid panel   TSH   Needs flu shot       Relevant Orders   Flu Vaccine QUAD 6+ mos PF IM (Fluarix Quad PF)   Need for Tdap vaccination       Relevant Orders   Tdap vaccine greater than or equal to 7yo IM   Breast cancer screening       Relevant Orders   MM DIGITAL SCREENING BILATERAL      Physical exam with no new findings.  Well adult with no acute concerns.  Grief response is normal at this time.  Plan: 1. Obtain health maintenance screenings as above according to age. - Increase physical activity to 30 minutes most days of the week.  - Eat healthy diet high in vegetables and fruits; low in refined carbohydrates. - Continue with labs as ordered above - Mammo for breast cancer screening. - Immunizations as ordered above. 2. Return 1 year for annual physical and 6 months for depression/anxiety/grief to consider reducing or stopping Paxil.    Follow up plan: Return in about 1 year (around 09/21/2019) for annual physical and 6 months for depression/anxiety/grief.  Wilhelmina Mcardle, DNP, AGPCNP-BC Adult Gerontology Primary Care  Nurse Practitioner [facility] 09/20/2018, 9:37 AM

## 2018-09-21 LAB — CBC WITH DIFFERENTIAL/PLATELET
Basophils Absolute: 99 cells/uL (ref 0–200)
Basophils Relative: 1.9 %
Eosinophils Absolute: 302 cells/uL (ref 15–500)
Eosinophils Relative: 5.8 %
HCT: 41.4 % (ref 35.0–45.0)
Hemoglobin: 14.3 g/dL (ref 11.7–15.5)
Lymphs Abs: 1310 cells/uL (ref 850–3900)
MCH: 30 pg (ref 27.0–33.0)
MCHC: 34.5 g/dL (ref 32.0–36.0)
MCV: 87 fL (ref 80.0–100.0)
MPV: 10.5 fL (ref 7.5–12.5)
Monocytes Relative: 8.8 %
Neutro Abs: 3032 cells/uL (ref 1500–7800)
Neutrophils Relative %: 58.3 %
Platelets: 273 10*3/uL (ref 140–400)
RBC: 4.76 10*6/uL (ref 3.80–5.10)
RDW: 12.6 % (ref 11.0–15.0)
Total Lymphocyte: 25.2 %
WBC mixed population: 458 cells/uL (ref 200–950)
WBC: 5.2 10*3/uL (ref 3.8–10.8)

## 2018-09-21 LAB — COMPLETE METABOLIC PANEL WITH GFR
AG Ratio: 1.6 (calc) (ref 1.0–2.5)
ALT: 15 U/L (ref 6–29)
AST: 17 U/L (ref 10–30)
Albumin: 4.4 g/dL (ref 3.6–5.1)
Alkaline phosphatase (APISO): 63 U/L (ref 33–115)
BUN: 11 mg/dL (ref 7–25)
CO2: 27 mmol/L (ref 20–32)
Calcium: 9.4 mg/dL (ref 8.6–10.2)
Chloride: 105 mmol/L (ref 98–110)
Creat: 0.79 mg/dL (ref 0.50–1.10)
GFR, Est African American: 107 mL/min/{1.73_m2} (ref >=60)
GFR, Est Non African American: 92 mL/min/{1.73_m2} (ref >=60)
Globulin: 2.7 g/dL (calc) (ref 1.9–3.7)
Glucose, Bld: 94 mg/dL (ref 65–99)
Potassium: 4.4 mmol/L (ref 3.5–5.3)
Sodium: 139 mmol/L (ref 135–146)
Total Bilirubin: 0.4 mg/dL (ref 0.2–1.2)
Total Protein: 7.1 g/dL (ref 6.1–8.1)

## 2018-09-21 LAB — HEMOGLOBIN A1C
Hgb A1c MFr Bld: 5.1 % of total Hgb (ref <5.7)
Mean Plasma Glucose: 100 (calc)
eAG (mmol/L): 5.5 (calc)

## 2018-09-21 LAB — LIPID PANEL
Cholesterol: 231 mg/dL — ABNORMAL HIGH (ref <200)
HDL: 75 mg/dL (ref >50)
LDL Cholesterol (Calc): 143 mg/dL (calc) — ABNORMAL HIGH
Non-HDL Cholesterol (Calc): 156 mg/dL (calc) — ABNORMAL HIGH (ref <130)
Total CHOL/HDL Ratio: 3.1 (calc) (ref <5.0)
Triglycerides: 42 mg/dL (ref <150)

## 2018-09-21 LAB — TSH: TSH: 1.23 mIU/L

## 2018-11-04 ENCOUNTER — Ambulatory Visit: Admit: 2018-11-04 | Payer: Self-pay

## 2018-11-04 ENCOUNTER — Ambulatory Visit (INDEPENDENT_AMBULATORY_CARE_PROVIDER_SITE_OTHER): Payer: Commercial Managed Care - PPO | Admitting: Nurse Practitioner

## 2018-11-04 ENCOUNTER — Other Ambulatory Visit: Payer: Self-pay

## 2018-11-04 ENCOUNTER — Ambulatory Visit
Admission: RE | Admit: 2018-11-04 | Discharge: 2018-11-04 | Disposition: A | Discharge status: Home / Self Care | Payer: Commercial Managed Care - PPO | Source: Ambulatory Visit | Attending: Nurse Practitioner | Admitting: Nurse Practitioner

## 2018-11-04 ENCOUNTER — Encounter: Payer: Self-pay | Admitting: Nurse Practitioner

## 2018-11-04 VITALS — BP 105/67 | HR 68 | Temp 98.2°F | Ht 62.0 in | Wt 141.2 lb

## 2018-11-04 DIAGNOSIS — M5441 Lumbago with sciatica, right side: Secondary | ICD-10-CM

## 2018-11-04 DIAGNOSIS — J45909 Unspecified asthma, uncomplicated: Secondary | ICD-10-CM | POA: Insufficient documentation

## 2018-11-04 DIAGNOSIS — Z0282 Encounter for adoption services: Secondary | ICD-10-CM | POA: Insufficient documentation

## 2018-11-04 DIAGNOSIS — Z1211 Encounter for screening for malignant neoplasm of colon: Secondary | ICD-10-CM

## 2018-11-04 MED ORDER — BACLOFEN 10 MG PO TABS: 5.0000 mg | ORAL_TABLET | Freq: Two times a day (BID) | ORAL | 0 refills | Status: DC

## 2018-11-04 NOTE — Patient Instructions (Addendum)
Maureen Allen,   Thank you for coming in to clinic today.  1. You have a lumbar muscle strain. Likely caused by overuse/strain with riding in a car. - Start taking Tylenol extra strength 1 to 2 tablets every 6-8 hours for aches or fever/chills for next few days as needed.  Do not take more than 3,000 mg in 24 hours from all medicines.  May take Ibuprofen as well if tolerated 200-400mg  every 8 hours as needed. May alternate tylenol and ibuprofen in same day. - Use heat and ice.  Apply this for 15 minutes at a time 6-8 times per day.   - Muscle rub with lidocaine, lidocaine patch, Biofreeze, or tiger balm for topical pain relief.  Avoid using this with heat and ice to avoid burns. - START muscle relaxer baclofen 5-10 mg one tablet up to three times daily.  This can cause drowsiness, so use caution.  Do not take when you will be driving.  It may be best to only take this at night for helping you during sleep.  2. Ribbon shaped bowel movements may have no significance, but it is a sign of colon cancer.  Please go to GI for an evaluation.  Please schedule a follow-up appointment with Wilhelmina McardleLauren Michaela Shankel, AGNP. Return 2-4 weeks if symptoms worsen or fail to improve.  If you have any other questions or concerns, please feel free to call the clinic or send a message through MyChart. You may also schedule an earlier appointment if necessary.  You will receive a survey after today's visit either digitally by e-mail or paper by [employer] mail. Your experiences and feedback matter to us.  Please respond so we know how we are doing as we provide care for you.   Wilhelmina McardleLauren Deliliah Spranger, DNP, AGNP-BC Adult Gerontology Nurse Practitioner [facility], [facility]  Low Back Pain Exercises See other page with pictures of each exercise.  Start with 1 or 2 of these exercises that you are most comfortable with. Do not do any exercises that cause you significant worsening pain. Some of these may cause some "stretching  soreness" but it should go away after you stop the exercise, and get better over time. Gradually increase up to 3-4 exercises as tolerated.  Standing hamstring stretch: Place the heel of your leg on a stool about 15 inches high. Keep your knee straight. Lean forward, bending at the hips until you feel a mild stretch in the back of your thigh. Make sure you do not roll your shoulders and bend at the waist when doing this or you will stretch your lower back instead. Hold the stretch for 15 to 30 seconds. Repeat 3 times. Repeat the same stretch on your other leg.  Cat and camel: Get down on your hands and knees. Let your stomach sag, allowing your back to curve downward. Hold this position for 5 seconds. Then arch your back and hold for 5 seconds. Do 3 sets of 10.  Quadriped Arm/Leg Raises: Get down on your hands and knees. Tighten your abdominal muscles to stiffen your spine. While keeping your abdominals tight, raise one arm and the opposite leg away from you. Hold this position for 5 seconds. Lower your arm and leg slowly and alternate sides. Do this 10 times on each side.  Pelvic tilt: Lie on your back with your knees bent and your feet flat on the floor. Tighten your abdominal muscles and push your lower back into the floor. Hold this position for 5 seconds,  then relax. Do 3 sets of 10.  Partial curl: Lie on your back with your knees bent and your feet flat on the floor. Tighten your stomach muscles and flatten your back against the floor. Tuck your chin to your chest. With your hands stretched out in front of you, curl your upper body forward until your shoulders clear the floor. Hold this position for 3 seconds. Don't hold your breath. It helps to breathe out as you lift your shoulders up. Relax. Repeat 10 times. Build to 3 sets of 10. To challenge yourself, clasp your hands behind your head and keep your elbows out to the side.  Lower trunk rotation: Lie on your back with your knees bent and your  feet flat on the floor. Tighten your abdominal muscles and push your lower back into the floor. Keeping your shoulders down flat, gently rotate your legs to one side, then the other as far as you can. Repeat 10 to 20 times.  Single knee to chest stretch: Lie on your back with your legs straight out in front of you. Bring one knee up to your chest and grasp the back of your thigh. Pull your knee toward your chest, stretching your buttock muscle. Hold this position for 15 to 30 seconds and return to the starting position. Repeat 3 times on each side.  Double knee to chest: Lie on your back with your knees bent and your feet flat on the floor. Tighten your abdominal muscles and push your lower back into the floor. Pull both knees up to your chest. Hold for 5 seconds and repeat 10 to 20 times.

## 2018-11-04 NOTE — Progress Notes (Signed)
Subjective:    Patient ID: Maureen Allen, female    DOB: 11-Oct-1976, 42 y.o.   MRN: [id number]  Maureen Allen is a 42 y.o. female presenting on 11/04/2018 for Back Pain (mid lower back pain that only occurs when she bends, cough or sneeze  x 6 weeks. Pt also notice changes with bowel movements )   HPI  Back Pain No known injury.  Only change patient notes in last 6 weeks is that she is driving more than normal for work.  In past couple of weeks has new seat cushion without significant improvement. Perhaps very small improvement.  Muscles feel more loose at later part of the day. Always worse first in am.  Sleeps usually on her side usually with pillow between legs, occasionally lays on stomach with one leg up.  - Is having to strain more for BM.  Usually eats high fiber diet.  Having a BM makes pain better.  Straining increases back pain, however.   - Concerning to patient is that she has had change in shape of BM to ribbon shaped BM.    She is adopted, so has no known family history.  Cannot exclude family history of colon cancer.  Social History   Tobacco Use  . Smoking status: Former Smoker    Last attempt to quit: 11/24/1999    Years since quitting: 18.9  . Smokeless tobacco: Never Used  Substance Use Topics  . Alcohol use: Yes    Alcohol/week: 1.0 standard drinks    Types: 1 Standard drinks or equivalent per week  . Drug use: No    Review of Systems Per HPI unless specifically indicated above     Objective:    BP 105/67 (BP Location: Right Arm, Patient Position: Sitting, Cuff Size: Normal)   Pulse 68   Temp 98.2 F (36.8 C) (Oral)   Ht 5\' 2"  (1.575 m)   Wt 141 lb 3.2 oz (64 kg)   BMI 25.83 kg/m   Wt Readings from Last 3 Encounters:  11/04/18 141 lb 3.2 oz (64 kg)  09/20/18 140 lb 12.8 oz (63.9 kg)  02/28/18 140 lb 3.2 oz (63.6 kg)    Physical Exam Vitals signs reviewed.  Constitutional:      General: She is not in acute distress.    Appearance: She is well-developed.    HENT:     Head: Normocephalic and atraumatic.  Abdominal:     General: Abdomen is flat. There is no distension.     Palpations: Abdomen is soft. There is no mass.     Tenderness: There is no abdominal tenderness. There is no guarding or rebound.     Hernia: No hernia is present.  Musculoskeletal:     Comments: Low Back Inspection: Normal appearance, Normal body habitus, no spinal deformity, symmetrical. Palpation: No tenderness over spinous processes. Right sided thoracolumbar paraspinal muscles  tender and with hypertonicity/spasm. ROM: Full active ROM forward flex / back extension, rotation L/R with mild right sided back discomfort Special Testing: Seated SLR negative for radicular pain bilaterally, but with reproduced localized R back pain.  Some radicular pain into R buttock when seated. Strength: Bilateral hip flex/ext 5/5, knee flex/ext 5/5, ankle dorsiflex/plantarflex 5/5 Neurovascular: intact distal sensation to light touch  Skin:    General: Skin is warm and dry.  Neurological:     Mental Status: She is alert and oriented to person, place, and time.  Psychiatric:        Behavior: Behavior normal.  Results for orders placed or performed in visit on 09/20/18  CBC with Differential/Platelet  Result Value Ref Range   WBC 5.2 3.8 - 10.8 Thousand/uL   RBC 4.76 3.80 - 5.10 Million/uL   Hemoglobin 14.3 11.7 - 15.5 g/dL   HCT 40.9 81.1 - 91.4 %   MCV 87.0 80.0 - 100.0 fL   MCH 30.0 27.0 - 33.0 pg   MCHC 34.5 32.0 - 36.0 g/dL   RDW 78.2 95.6 - 21.3 %   Platelets 273 140 - 400 Thousand/uL   MPV 10.5 7.5 - 12.5 fL   Neutro Abs 3,032 1,500 - 7,800 cells/uL   Lymphs Abs 1,310 850 - 3,900 cells/uL   WBC mixed population 458 200 - 950 cells/uL   Eosinophils Absolute 302 15 - 500 cells/uL   Basophils Absolute 99 0 - 200 cells/uL   Neutrophils Relative % 58.3 %   Total Lymphocyte 25.2 %   Monocytes Relative 8.8 %   Eosinophils Relative 5.8 %   Basophils Relative 1.9 %   COMPLETE METABOLIC PANEL WITH GFR  Result Value Ref Range   Glucose, Bld 94 65 - 99 mg/dL   BUN 11 7 - 25 mg/dL   Creat 0.86 5.78 - 4.69 mg/dL   GFR, Est Non African American 92 > OR = 60 mL/min/1.39m2   GFR, Est African American 107 > OR = 60 mL/min/1.76m2   BUN/Creatinine Ratio NOT APPLICABLE 6 - 22 (calc)   Sodium 139 135 - 146 mmol/L   Potassium 4.4 3.5 - 5.3 mmol/L   Chloride 105 98 - 110 mmol/L   CO2 27 20 - 32 mmol/L   Calcium 9.4 8.6 - 10.2 mg/dL   Total Protein 7.1 6.1 - 8.1 g/dL   Albumin 4.4 3.6 - 5.1 g/dL   Globulin 2.7 1.9 - 3.7 g/dL (calc)   AG Ratio 1.6 1.0 - 2.5 (calc)   Total Bilirubin 0.4 0.2 - 1.2 mg/dL   Alkaline phosphatase (APISO) 63 33 - 115 U/L   AST 17 10 - 30 U/L   ALT 15 6 - 29 U/L  Hemoglobin A1c  Result Value Ref Range   Hgb A1c MFr Bld 5.1 <5.7 % of total Hgb   Mean Plasma Glucose 100 (calc)   eAG (mmol/L) 5.5 (calc)  Lipid panel  Result Value Ref Range   Cholesterol 231 (H) <200 mg/dL   HDL 75 >62 mg/dL   Triglycerides 42 <952 mg/dL   LDL Cholesterol (Calc) 143 (H) mg/dL (calc)   Total CHOL/HDL Ratio 3.1 <5.0 (calc)   Non-HDL Cholesterol (Calc) 156 (H) <130 mg/dL (calc)  TSH  Result Value Ref Range   TSH 1.23 mIU/L      Assessment & Plan:   Problem List Items Addressed This Visit    None    Visit Diagnoses    Colon cancer screening    -  Primary Patient with ribbon-like BM which is change in last 6 weeks and is associated with back pain relieved after BM.  Referral to GI for colon cancer screening given no known family history (adopted).   Relevant Orders   Ambulatory referral to Gastroenterology   Acute right-sided low back pain with right-sided sciatica   Pain likely self-limited.  Muscle strain possible complicated by extended car riding, some stomach sleeping. Cannot exclude somatic pain as described above for colon cancer screen.  Plan:  1. Treat with OTC pain meds (acetaminophen and ibuprofen).  Discussed alternate dosing and  max dosing. 2. Apply heat and/or  ice to affected area. 3. May also apply a muscle rub with lidocaine or lidocaine patch after heat or ice. 4. Take muscle relaxer baclofen 5-10 mg up to three times daily.  Cautioned drowsiness and avoidance of med when/prior to driving. 5. Follow up prn 2-4 weeks.    Relevant Medications   baclofen (LIORESAL) 10 MG tablet   Other Relevant Orders   DG Lumbar Spine Complete (Completed)        Meds ordered this encounter  Medications  . baclofen (LIORESAL) 10 MG tablet    Sig: Take 0.5-1 tablets (5-10 mg total) by mouth 2 (two) times daily.    Dispense:  30 each    Refill:  0    Order Specific Question:   Supervising Provider    Answer:   Smitty CordsKARAMALEGOS, ALEXANDER J [[id number]]    Follow up plan: Return 2-4 weeks if symptoms worsen or fail to improve.  Wilhelmina McardleLauren Lola Lofaro, DNP, AGPCNP-BC Adult Gerontology Primary Care Nurse Practitioner [facility] 11/04/2018, 3:09 PM

## 2018-11-25 ENCOUNTER — Other Ambulatory Visit: Payer: Self-pay

## 2018-11-25 ENCOUNTER — Telehealth: Payer: Self-pay | Admitting: Gastroenterology

## 2018-11-25 DIAGNOSIS — Z1211 Encounter for screening for malignant neoplasm of colon: Secondary | ICD-10-CM

## 2018-11-25 NOTE — Telephone Encounter (Signed)
Patient returned call to schedule her colonoscopy. Please call.

## 2018-11-25 NOTE — Telephone Encounter (Signed)
Patient has been scheduled for colonoscopy with Dr. Tobi BastosAnna on 12/16/18.  Thanks [employer]

## 2018-12-01 ENCOUNTER — Telehealth: Payer: Self-pay | Admitting: Nurse Practitioner

## 2018-12-01 NOTE — Telephone Encounter (Signed)
I attempted to contact the pt again, no answer. LMOm to return my call.

## 2018-12-01 NOTE — Telephone Encounter (Signed)
Pt called said that she have had a change in her BM now they are normal, wanted to know if she still need her colonoscopy.

## 2018-12-01 NOTE — Telephone Encounter (Signed)
This is up to patient.  If shape has returned to normal, she can choose not to have colonoscopy.  I would recommend we at least check for hemoccult cards x 3 if she decides not to continue with colonoscopy. She can also meet with GI and discuss the procedure without committing to having it done and discuss her risk factors prior to having this colon cancer screening.

## 2018-12-01 NOTE — Telephone Encounter (Signed)
I attempted to contact the pt, no answer. LMOM to return my call. 

## 2018-12-12 ENCOUNTER — Telehealth: Payer: Self-pay | Admitting: Gastroenterology

## 2018-12-12 NOTE — Telephone Encounter (Signed)
Pt left vm to r/s her procedure 12/16/18

## 2018-12-13 NOTE — Telephone Encounter (Signed)
Returned patients call to reschedule colonoscopy.  Originally scheduled on  12/16/18 with [facility].  Has now been moved to 12/23/18 [facility] with Dr. Tobi BastosAnna.  Referral has been updated.  Thanks [employer]

## 2018-12-22 ENCOUNTER — Encounter: Payer: Self-pay | Admitting: Anesthesiology

## 2018-12-23 ENCOUNTER — Ambulatory Visit
Admission: RE | Admit: 2018-12-23 | Discharge: 2018-12-23 | Disposition: A | Discharge status: Home / Self Care | Payer: Commercial Managed Care - PPO | Source: Ambulatory Visit | Attending: Gastroenterology | Admitting: Gastroenterology

## 2018-12-23 ENCOUNTER — Ambulatory Visit: Payer: Commercial Managed Care - PPO | Admitting: Anesthesiology

## 2018-12-23 ENCOUNTER — Encounter
Admission: RE | Disposition: A | Discharge status: Home / Self Care | Payer: Self-pay | Source: Ambulatory Visit | Attending: Gastroenterology

## 2018-12-23 DIAGNOSIS — Z1211 Encounter for screening for malignant neoplasm of colon: Secondary | ICD-10-CM | POA: Diagnosis not present

## 2018-12-23 DIAGNOSIS — Z79899 Other long term (current) drug therapy: Secondary | ICD-10-CM | POA: Insufficient documentation

## 2018-12-23 DIAGNOSIS — R195 Other fecal abnormalities: Secondary | ICD-10-CM

## 2018-12-23 DIAGNOSIS — J45909 Unspecified asthma, uncomplicated: Secondary | ICD-10-CM | POA: Insufficient documentation

## 2018-12-23 DIAGNOSIS — Z87891 Personal history of nicotine dependence: Secondary | ICD-10-CM | POA: Diagnosis not present

## 2018-12-23 DIAGNOSIS — I10 Essential (primary) hypertension: Secondary | ICD-10-CM | POA: Insufficient documentation

## 2018-12-23 DIAGNOSIS — F419 Anxiety disorder, unspecified: Secondary | ICD-10-CM | POA: Insufficient documentation

## 2018-12-23 DIAGNOSIS — Z7951 Long term (current) use of inhaled steroids: Secondary | ICD-10-CM | POA: Diagnosis not present

## 2018-12-23 HISTORY — PX: COLONOSCOPY WITH PROPOFOL: SHX5780

## 2018-12-23 SURGERY — COLONOSCOPY WITH PROPOFOL
Anesthesia: General

## 2018-12-23 MED ORDER — PROPOFOL 500 MG/50ML IV EMUL
INTRAVENOUS | Status: DC | PRN
  Administered 2018-12-23: 185 ug/kg/min via INTRAVENOUS

## 2018-12-23 MED ORDER — SODIUM CHLORIDE 0.9 % IV SOLN
INTRAVENOUS | Status: DC
  Administered 2018-12-23: 1000 mL via INTRAVENOUS

## 2018-12-23 MED ORDER — PROPOFOL 10 MG/ML IV BOLUS
INTRAVENOUS | Status: AC
  Filled 2018-12-23: qty 40

## 2018-12-23 MED ORDER — PROPOFOL 10 MG/ML IV BOLUS
INTRAVENOUS | Status: DC | PRN
  Administered 2018-12-23: 50 mg via INTRAVENOUS

## 2018-12-23 NOTE — Anesthesia Postprocedure Evaluation (Signed)
Anesthesia Post Note  Patient: Maureen Allen  Procedure(s) Performed: COLONOSCOPY WITH PROPOFOL (N/A )  Patient location during evaluation: Endoscopy Anesthesia Type: General Level of consciousness: awake and alert Pain management: pain level controlled Vital Signs Assessment: post-procedure vital signs reviewed and stable Respiratory status: spontaneous breathing, nonlabored ventilation, respiratory function stable and patient connected to nasal cannula oxygen Cardiovascular status: blood pressure returned to baseline and stable Postop Assessment: no apparent nausea or vomiting Anesthetic complications: no     Last Vitals:  Vitals:   12/23/18 1009 12/23/18 1019  BP: 118/88 134/82  Pulse:    Resp: 13 17  Temp:    SpO2: 98% 99%    Last Pain:  Vitals:   12/23/18 1019  TempSrc:   PainSc: 0-No pain                 Phung Kotas S

## 2018-12-23 NOTE — Op Note (Signed)
[facility] Gastroenterology Patient Name: Maureen Lamasnna Allen Procedure Date: 12/23/2018 9:38 AM MRN: [id number] Account #: [id number] Date of Birth: 1976/05/31 Admit Type: Outpatient Age: [age] Room: [facility] ENDO ROOM 4 Gender: Female Note Status: Finalized Procedure:            Colonoscopy Indications:          Change in stool caliber Providers:            Wyline MoodKiran Dilyn MD, MD Referring MD:         Galen ManilaLauren Renee Kennedy (Referring MD) Medicines:            Monitored Anesthesia Care Complications:        No immediate complications. Procedure:            Pre-Anesthesia Assessment:                       - Prior to the procedure, a History and Physical was                        performed, and patient medications, allergies and                        sensitivities were reviewed. The patient's tolerance of                        previous anesthesia was reviewed.                       - The risks and benefits of the procedure and the                        sedation options and risks were discussed with the                        patient. All questions were answered and informed                        consent was obtained.                       - The risks and benefits of the procedure and the                        sedation options and risks were discussed with the                        patient. All questions were answered and informed                        consent was obtained.                       - ASA Grade Assessment: II - A patient with mild                        systemic disease.                       After obtaining informed consent, the colonoscope was  passed under direct vision. Throughout the procedure,                        the patient's blood pressure, pulse, and oxygen                        saturations were monitored continuously. The                        Colonoscope was introduced through the anus and                        advanced to the  the cecum, identified by the                        appendiceal orifice, IC valve and transillumination.                        The colonoscopy was performed with ease. The patient                        tolerated the procedure well. The quality of the bowel                        preparation was good. Findings:      The perianal and digital rectal examinations were normal.      The entire examined colon appeared normal on direct and retroflexion       views. Impression:           - The entire examined colon is normal on direct and                        retroflexion views.                       - No specimens collected. Recommendation:       - Discharge patient to home (with escort).                       - Resume previous diet.                       - Continue present medications.                       - Repeat colonoscopy in 10 years for screening purposes. Procedure Code(s):    --- Professional ---                       [id number], Colonoscopy, flexible; diagnostic, including                        collection of specimen(s) by brushing or washing, when                        performed (separate procedure) Diagnosis Code(s):    --- Professional ---                       R19.5, Other fecal abnormalities CPT copyright 2018 American Medical Association. All rights reserved. The codes documented in this report are preliminary and upon coder review may  be  revised to meet current compliance requirements. Wyline MoodKiran Aidah, MD Wyline MoodKiran Yamilka MD, MD 12/23/2018 9:55:19 AM This report has been signed electronically. Number of Addenda: 0 Note Initiated On: 12/23/2018 9:38 AM Scope Withdrawal Time: 0 hours 7 minutes 42 seconds  Total Procedure Duration: 0 hours 9 minutes 46 seconds       [facility]

## 2018-12-23 NOTE — Transfer of Care (Signed)
Immediate Anesthesia Transfer of Care Note  Patient: Maureen Allen  Procedure(s) Performed: COLONOSCOPY WITH PROPOFOL (N/A )  Patient Location: Endoscopy Unit  Anesthesia Type:General  Level of Consciousness: awake, alert  and oriented  Airway & Oxygen Therapy: Patient Spontanous Breathing and Patient connected to nasal cannula oxygen  Post-op Assessment: Report given to RN and Post -op Vital signs reviewed and stable  Post vital signs: Reviewed and stable  Last Vitals:  Vitals Value Taken Time  BP    Temp    Pulse    Resp    SpO2      Last Pain:  Vitals:   12/23/18 0902  TempSrc: Tympanic  PainSc: 3          Complications: No apparent anesthesia complications

## 2018-12-23 NOTE — Anesthesia Post-op Follow-up Note (Signed)
Anesthesia QCDR form completed.        

## 2018-12-23 NOTE — Anesthesia Preprocedure Evaluation (Addendum)
Anesthesia Evaluation  Patient identified by MRN, date of birth, ID band Patient awake    Reviewed: Allergy & Precautions, NPO status , Patient's Chart, lab work & pertinent test results, reviewed documented beta blocker date and time   Airway Mallampati: II  TM Distance: >3 FB     Dental  (+) Chipped   Pulmonary asthma , former smoker,           Cardiovascular hypertension, Pt. on medications      Neuro/Psych Anxiety    GI/Hepatic   Endo/Other    Renal/GU      Musculoskeletal   Abdominal   Peds  Hematology   Anesthesia Other Findings   Reproductive/Obstetrics                             Anesthesia Physical Anesthesia Plan  ASA: II  Anesthesia Plan: General   Post-op Pain Management:    Induction: Intravenous  PONV Risk Score and Plan:   Airway Management Planned:   Additional Equipment:   Intra-op Plan:   Post-operative Plan:   Informed Consent: I have reviewed the patients History and Physical, chart, labs and discussed the procedure including the risks, benefits and alternatives for the proposed anesthesia with the patient or authorized representative who has indicated his/her understanding and acceptance.       Plan Discussed with: CRNA  Anesthesia Plan Comments:         Anesthesia Quick Evaluation

## 2018-12-23 NOTE — H&P (Addendum)
Wyline Mood, MD [address], [address], [place], [place], [address] [address], [place], [place], [address] Phone: [phone]  Fax: [phone]  Primary Care Physician:  Galen Manila, NP   Pre-Procedure History & Physical: HPI:  Maureen Allen is a 43 y.o. female is here for an colonoscopy.   Past Medical History:  Diagnosis Date  . Allergy    oak leaves fall, blooming  . Anxiety    occasionally has panic attack due to large extra stressor  . Asthma   . Pre-eclampsia     Past Surgical History:  Procedure Laterality Date  . MOUTH SURGERY  1993    Prior to Admission medications   Medication Sig Start Date End Date Taking? Authorizing Provider  albuterol (PROVENTIL HFA;VENTOLIN HFA) 108 (90 Base) MCG/ACT inhaler INHALE 1-2 PUFFS INTO THE LUNGS EVERY 6 (SIX) HOURS AS NEEDED FOR WHEEZING OR SHORTNESS OF BREATH. 08/11/18  Yes Galen Manila, NP  baclofen (LIORESAL) 10 MG tablet Take 0.5-1 tablets (5-10 mg total) by mouth 2 (two) times daily. 11/04/18  Yes Galen Manila, NP  Fluticasone-Salmeterol (ADVAIR) 100-50 MCG/DOSE AEPB Inhale 1 puff into the lungs once.   Yes [provider]  PARoxetine (PAXIL) 20 MG tablet FOR FIRST 7 DAYS, TAKE 1/2 (10 MG) TABLET ONCE DAILY. THEN, TAKE 1 FULL TABLET (20 MG) AND CONTINUE. 08/10/18  Yes Galen Manila, NP  ipratropium (ATROVENT) 0.06 % nasal spray Place 2 sprays into both nostrils 4 (four) times daily for 7 days. 11/10/17 11/17/17  Galen Manila, NP  loratadine (CLARITIN) 10 MG tablet Take 10 mg by mouth daily.    [provider]    Allergies as of 11/25/2018 - Review Complete 11/04/2018  Allergen Reaction Noted  . Other Shortness Of Breath 01/21/2013  . Bee venom Swelling 01/21/2013  . Latex Rash 09/09/2015    Family History  Adopted: Yes  Problem Relation Age of Onset  . Healthy Son     Social History   Socioeconomic History  . Marital status: Married   Spouse name: Teresina Koen  . Number of children: Not on file  . Years of education: Not on file  . Highest education level: Not on file  Occupational History  . 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 Financial resource strain: Not on file  . Food insecurity:    Worry: Not on file    Inability: Not on file  . Transportation needs:    Medical: Not on file    Non-medical: Not on file  Tobacco Use  . Smoking status: Former Smoker    Last attempt to quit: 11/24/1999    Years since quitting: 19.0  . Smokeless tobacco: Never Used  Substance and Sexual Activity  . Alcohol use: Yes    Alcohol/week: 1.0 standard drinks    Types: 1 Standard drinks or equivalent per week  . Drug use: No  . Sexual activity: Yes    Birth control/protection: Surgical    Comment: partner vasectomy  Lifestyle  . Physical activity:    Days per week: Not on file    Minutes per session: Not on file  . Stress: Not on file  Relationships  . Social connections:    Talks on phone: Not on file    Gets together: Not on file    Attends religious service: Not on file    Active member of club or organization: Not on file    Attends  meetings of clubs or organizations: Not on file    Relationship status: Not on file  . Intimate partner violence:    Fear of current or ex partner: Not on file    Emotionally abused: Not on file    Physically abused: Not on file    Forced sexual activity: Not on file  Other Topics Concern  . Not on file  Social History Narrative  . Not on file    Review of Systems: See HPI, otherwise negative ROS  Physical Exam: BP 113/81   Pulse 97   Temp (!) 96.8 F (36 C) (Tympanic)   Resp 20   Ht 5\' 2"  (1.575 m)   Wt 61.2 kg   SpO2 97%   BMI 24.69 kg/m  General:   Alert,  pleasant and cooperative in NAD Head:  Normocephalic and atraumatic. Neck:  Supple; no masses or thyromegaly. Lungs:  Clear throughout to auscultation, normal respiratory effort.    Heart:  +S1, +S2,  Regular rate and rhythm, No edema. Abdomen:  Soft, nontender and nondistended. Normal bowel sounds, without guarding, and without rebound.   Neurologic:  Alert and  oriented x4;  grossly normal neurologically.  Impression/Plan: Maureen Allen is here for an colonoscopy to be performed for change in bowel habits Risks, benefits, limitations, and alternatives regarding  colonoscopy have been reviewed with the patient.  Questions have been answered.  All parties agreeable.   Wyline Mood, MD  12/23/2018, 9:27 AM

## 2018-12-26 ENCOUNTER — Encounter: Payer: Self-pay | Admitting: Gastroenterology

## 2019-02-22 ENCOUNTER — Other Ambulatory Visit: Payer: Self-pay | Admitting: Nurse Practitioner

## 2019-02-22 DIAGNOSIS — F4321 Adjustment disorder with depressed mood: Secondary | ICD-10-CM

## 2019-02-22 DIAGNOSIS — F419 Anxiety disorder, unspecified: Secondary | ICD-10-CM

## 2019-03-23 ENCOUNTER — Ambulatory Visit: Payer: Commercial Managed Care - PPO | Admitting: Nurse Practitioner

## 2019-03-23 ENCOUNTER — Ambulatory Visit (INDEPENDENT_AMBULATORY_CARE_PROVIDER_SITE_OTHER): Payer: Commercial Managed Care - PPO | Admitting: Family Medicine

## 2019-03-23 ENCOUNTER — Encounter: Payer: Self-pay | Admitting: Family Medicine

## 2019-03-23 ENCOUNTER — Other Ambulatory Visit: Payer: Self-pay

## 2019-03-23 DIAGNOSIS — F324 Major depressive disorder, single episode, in partial remission: Secondary | ICD-10-CM | POA: Diagnosis not present

## 2019-03-23 DIAGNOSIS — F419 Anxiety disorder, unspecified: Secondary | ICD-10-CM

## 2019-03-23 DIAGNOSIS — F4321 Adjustment disorder with depressed mood: Secondary | ICD-10-CM | POA: Diagnosis not present

## 2019-03-23 DIAGNOSIS — F325 Major depressive disorder, single episode, in full remission: Secondary | ICD-10-CM | POA: Insufficient documentation

## 2019-03-23 MED ORDER — PAROXETINE HCL 20 MG PO TABS: 20.0000 mg | ORAL_TABLET | Freq: Every day | ORAL | 1 refills | Status: DC

## 2019-03-23 NOTE — Progress Notes (Signed)
Subjective:    Patient ID: Maureen Allen, female    DOB: [birth year], 43 y.o.   MRN: [id number]  Maureen Allen is a 43 y.o. female presenting on 03/23/2019 for Depression and Anxiety  Virtual / Telehealth Encounter - Video Visit via [email or web address] The purpose of this virtual visit is to provide medical care while limiting exposure to the novel coronavirus (COVID19) for both patient and office staff.  Consent was obtained for remote visit:  Yes.   Answered questions that patient had about telehealth interaction:  Yes.   I discussed the limitations, risks, security and privacy concerns of performing an evaluation and management service by video/telephone. I also discussed with the patient that there may be a patient responsible charge related to this service. The patient expressed understanding and agreed to proceed.  Patient Location: Home Provider Location: [facility] (Office)  PCP is Wilhelmina Mcardle, AGPCNP-BC - I am currently covering during her maternity leave.  HPI   Chief Complaint  Patient presents with  . Depression  . Anxiety    Anxiety / Major Depression single episode in partial remission / Grief reaction Background history of mother passing away in 12/2017, she has had significant symptoms mood and anxiety, with depression and grief reaction. Treated on SSRI has done well. Has good support system.  - Today reports that overall since last visit, her mood and anxiety are gradually improving with time since her mother's passing. She is managing this fairly well. She describes anxiety as her primary symptom that seems to trigger other feels of grief and mood at times. See scores below for details ROS -- Taking Paroxetine 20mg  daily. Rarely misses dose of Paroxetine, if she does she does feel some mild anxiety worsening later that day to trigger her to take the medicine - Some life stressors with current coronavirus pandemic. But she is at home, with her family and this has been  helpful for her.   Additional follow-up  History of back pain - has now resolved, previously treated by PCP for this in past 6 months, has improved with conservative medicine, baclofen PRN, and heating pad. No further issues, providing update today. She thinks had tense spasm muscles due to stress and anxiety  Health Maintenance:  Colon CA Screening: Last Colonoscopy (done by [facility] Dr Tobi Bastos), diagnostic due to change in stool caliber and back pain, results with normal colon without polyp or abnormality, good for 10 years. Currently asymptomatic. No known family history of colon CA.  - Next due colonoscopy age 86 - 10 years  Family History  Adopted: Yes  Problem Relation Age of Onset  . Healthy Son      Depression screen [facility] 2/9 03/23/2019 02/28/2018 01/17/2018  Decreased Interest 1 1 0  Down, Depressed, Hopeless 1 0 0  PHQ - 2 Score 2 1 0  Altered sleeping 1 0 1  Tired, decreased energy 1 0 0  Change in appetite 0 0 1  Feeling bad or failure about yourself  0 0 0  Trouble concentrating 0 1 1  Moving slowly or fidgety/restless 1 0 1  Suicidal thoughts 0 0 0  PHQ-9 Score 5 2 4   Difficult doing work/chores Somewhat difficult Somewhat difficult Very difficult   GAD 7 : Generalized Anxiety Score 03/23/2019 02/28/2018 01/17/2018 11/10/2017  Nervous, Anxious, on Edge 1 2 1  0  Control/stop worrying 1 1 1  0  Worry too much - different things 1 1 1  0  Trouble relaxing 0 0 1  0  Restless 1 0 1 0  Easily annoyed or irritable Afraid - awful might happen 0 1 1 0  Total GAD 7 Score Anxiety Difficulty Somewhat difficult Somewhat difficult Extremely difficult Not difficult at all     Social History   Tobacco Use  . Smoking status: Former Smoker    Last attempt to quit: 11/24/1999    Years since quitting: 19.3  . Smokeless tobacco: Never Used  Substance Use Topics  . Alcohol use: Yes    Alcohol/week: 1.0 standard drinks    Types: 1 Standard drinks or equivalent per  week  . Drug use: No    Review of Systems Per HPI unless specifically indicated above     Objective:    There were no vitals taken for this visit.  Wt Readings from Last 3 Encounters:  12/23/18 135 lb (61.2 kg)  11/04/18 141 lb 3.2 oz (64 kg)  09/20/18 140 lb 12.8 oz (63.9 kg)    Physical Exam   Note examination was completely remotely via video observation objective data only  Gen - well-appearing, no acute distress or apparent pain, comfortable HEENT - eyes appear clear without discharge or redness Heart/Lungs - cannot examine virtually - observed no evidence of coughing or labored breathing. Skin - face visible today- no rash Neuro - awake, alert, oriented Psych - not anxious appearing, good mood, not depressed, pleasant, good insight into mental health  Results for orders placed or performed in visit on 09/20/18  CBC with Differential/Platelet  Result Value Ref Range   WBC 5.2 3.8 - 10.8 Thousand/uL   RBC 4.76 3.80 - 5.10 Million/uL   Hemoglobin 14.3 11.7 - 15.5 g/dL   HCT 04.5 40.9 - 81.1 %   MCV 87.0 80.0 - 100.0 fL   MCH 30.0 27.0 - 33.0 pg   MCHC 34.5 32.0 - 36.0 g/dL   RDW 91.4 78.2 - 95.6 %   Platelets 273 140 - 400 Thousand/uL   MPV 10.5 7.5 - 12.5 fL   Neutro Abs 3,032 1,500 - 7,800 cells/uL   Lymphs Abs 1,310 850 - 3,900 cells/uL   WBC mixed population 458 200 - 950 cells/uL   Eosinophils Absolute 302 15 - 500 cells/uL   Basophils Absolute 99 0 - 200 cells/uL   Neutrophils Relative % 58.3 %   Total Lymphocyte 25.2 %   Monocytes Relative 8.8 %   Eosinophils Relative 5.8 %   Basophils Relative 1.9 %  COMPLETE METABOLIC PANEL WITH GFR  Result Value Ref Range   Glucose, Bld 94 65 - 99 mg/dL   BUN 11 7 - 25 mg/dL   Creat 2.13 0.86 - 5.78 mg/dL   GFR, Est Non African American 92 > OR = 60 mL/min/1.11m2   GFR, Est African American 107 > OR = 60 mL/min/1.53m2   BUN/Creatinine Ratio NOT APPLICABLE 6 - 22 (calc)   Sodium 139 135 - 146 mmol/L   Potassium  4.4 3.5 - 5.3 mmol/L   Chloride 105 98 - 110 mmol/L   CO2 27 20 - 32 mmol/L   Calcium 9.4 8.6 - 10.2 mg/dL   Total Protein 7.1 6.1 - 8.1 g/dL   Albumin 4.4 3.6 - 5.1 g/dL   Globulin 2.7 1.9 - 3.7 g/dL (calc)   AG Ratio 1.6 1.0 - 2.5 (calc)   Total Bilirubin 0.4 0.2 - 1.2 mg/dL   Alkaline phosphatase (APISO) 63 33 - 115 U/L   AST  17 10 - 30 U/L   ALT 15 6 - 29 U/L  Hemoglobin A1c  Result Value Ref Range   Hgb A1c MFr Bld 5.1 <5.7 % of total Hgb   Mean Plasma Glucose 100 (calc)   eAG (mmol/L) 5.5 (calc)  Lipid panel  Result Value Ref Range   Cholesterol 231 (H) <200 mg/dL   HDL 75 >78>50 mg/dL   Triglycerides 42 <295<150 mg/dL   LDL Cholesterol (Calc) 143 (H) mg/dL (calc)   Total CHOL/HDL Ratio 3.1 <5.0 (calc)   Non-HDL Cholesterol (Calc) 156 (H) <130 mg/dL (calc)  TSH  Result Value Ref Range   TSH 1.23 mIU/L      Assessment & Plan:   Problem List Items Addressed This Visit    Anxiety - Primary    Gradually improving fairly generalized anxiety - triggered by grief / loss of mother little over 1 year ago, associated with episode of depression/prolong grief - Doing well on SSRI  Plan: 1. Continue current therapy - Paroxetine 20mg  daily, refilled, 90 day 2. Continue staying active, and utilizing her support system 3. Follow-up with PCP in 6 months      Relevant Medications   PARoxetine (PAXIL) 20 MG tablet   Major depression single episode, in partial remission (HCC)    Improved in partial remission, still persistent mild episodic depression - mostly trigger by anxiety w/ loss of her mother See A&P Anxiety On SSRI      Relevant Medications   PARoxetine (PAXIL) 20 MG tablet    Other Visit Diagnoses    Grief       Relevant Medications   PARoxetine (PAXIL) 20 MG tablet      Meds ordered this encounter  Medications  . PARoxetine (PAXIL) 20 MG tablet    Sig: Take 1 tablet (20 mg total) by mouth daily.    Dispense:  90 tablet    Refill:  1    Add refill     Follow-up: - Return in 6 months for Annual Physical - Future labs to be ordered at that time.  Patient verbalizes understanding with the above medical recommendations including the limitation of remote medical advice.  Specific follow-up and call-back criteria were given for patient to follow-up or seek medical care more urgently if needed.  Total duration of direct patient care provided via video conference: 15 minutes   Saralyn PilarAlexander , DO [facility] 03/23/2019, 9:20 AM

## 2019-03-23 NOTE — Assessment & Plan Note (Signed)
Gradually improving fairly generalized anxiety - triggered by grief / loss of mother little over 1 year ago, associated with episode of depression/prolong grief - Doing well on SSRI  Plan: 1. Continue current therapy - Paroxetine 20mg  daily, refilled, 90 day 2. Continue staying active, and utilizing her support system 3. Follow-up with PCP in 6 months

## 2019-03-23 NOTE — Patient Instructions (Addendum)
Maureen Allen,  I have re ordered the Paroxetine 20mg  - for 90 day supply again, added a refill - they can keep this on file for now if you are not ready for new bottle.  [name] will return in 3 months.  Anticipate scheduling your Yearly Physical with blood work in 6 months end of October early November  DUE for FASTING BLOOD WORK (no food or drink after midnight before the lab appointment, only water or coffee without cream/sugar on the morning of)  SCHEDULE "Lab Only" visit in the morning at the clinic for lab draw in 6 MONTHS   - Make sure Lab Only appointment is at about 1 week before your next appointment, so that results will be available  For Lab Results, once available within 2-3 days of blood draw, you can can log in to MyChart online to view your results and a brief explanation. Also, we can discuss results at next follow-up visit.   Please schedule a Follow-up Appointment to: Return in about 6 months (around 09/22/2019) for Annual Physical.  If you have any other questions or concerns, please feel free to call the office or send a message through MyChart. You may also schedule an earlier appointment if necessary.  Additionally, you may be receiving a survey about your experience at our office within a few days to 1 week by e-mail or mail. We value your feedback.  Saralyn Pilar, DO [facility], [place]

## 2019-03-23 NOTE — Assessment & Plan Note (Addendum)
Improved in partial remission, still persistent mild episodic depression - mostly trigger by anxiety w/ loss of her mother See A&P Anxiety On SSRI

## 2019-06-15 ENCOUNTER — Telehealth: Payer: Self-pay

## 2019-06-15 DIAGNOSIS — J4520 Mild intermittent asthma, uncomplicated: Secondary | ICD-10-CM

## 2019-06-15 MED ORDER — ALBUTEROL SULFATE HFA 108 (90 BASE) MCG/ACT IN AERS: 1.0000 | INHALATION_SPRAY | Freq: Four times a day (QID) | RESPIRATORY_TRACT | 1 refills | Status: DC | PRN

## 2019-06-15 MED ORDER — FLUTICASONE-SALMETEROL 100-50 MCG/DOSE IN AEPB: 1.0000 | INHALATION_SPRAY | Freq: Two times a day (BID) | RESPIRATORY_TRACT | 5 refills | Status: DC

## 2019-06-15 NOTE — Telephone Encounter (Signed)
Patient is requesting a refill on albuterol and Advair to [facility]

## 2019-06-23 ENCOUNTER — Other Ambulatory Visit: Payer: Self-pay | Admitting: Nurse Practitioner

## 2019-06-23 DIAGNOSIS — J4520 Mild intermittent asthma, uncomplicated: Secondary | ICD-10-CM

## 2019-09-25 ENCOUNTER — Other Ambulatory Visit: Payer: Self-pay | Admitting: Nurse Practitioner

## 2019-09-25 DIAGNOSIS — J4520 Mild intermittent asthma, uncomplicated: Secondary | ICD-10-CM

## 2019-12-17 ENCOUNTER — Other Ambulatory Visit: Payer: Self-pay | Admitting: Nurse Practitioner

## 2019-12-19 ENCOUNTER — Other Ambulatory Visit: Payer: Self-pay | Admitting: Family Medicine

## 2019-12-19 DIAGNOSIS — J4520 Mild intermittent asthma, uncomplicated: Secondary | ICD-10-CM

## 2020-03-21 ENCOUNTER — Other Ambulatory Visit: Payer: Self-pay | Admitting: Family Medicine

## 2020-03-21 NOTE — Telephone Encounter (Signed)
Requested  medications are  due for refill today yes  Requested medications are on the active medication list yes  Last refill 1/26  Future visit scheduled no  Notes to clinic failed protocol due to no visit within 12 months, has already had a curtesy refill

## 2020-05-23 IMAGING — DX DG LUMBAR SPINE COMPLETE 4+V
5 series · 5 of 5 positions shown · non-contrast
Comparison: None.

CLINICAL DATA: Acute low back pain for 6 weeks.  No trauma.

EXAM:
LUMBAR SPINE - COMPLETE 4+ VIEW

[l-spine ap]
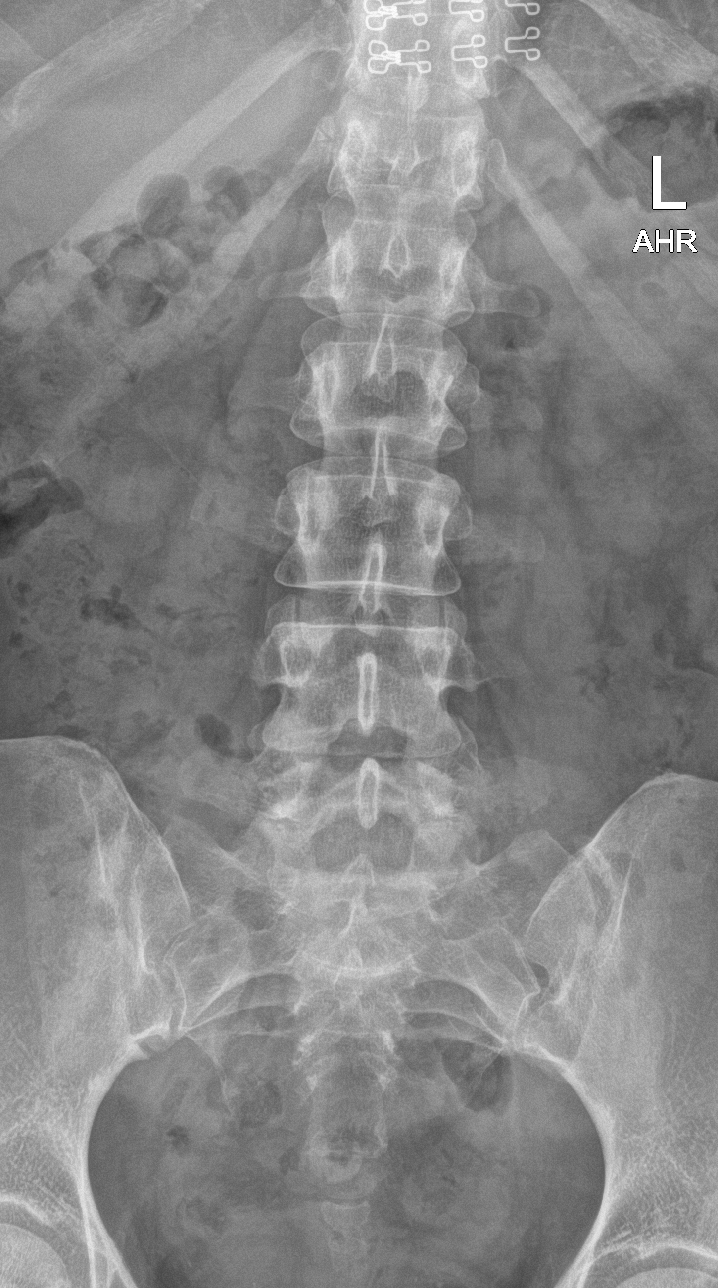

[l-spine obl (1 of 2)]
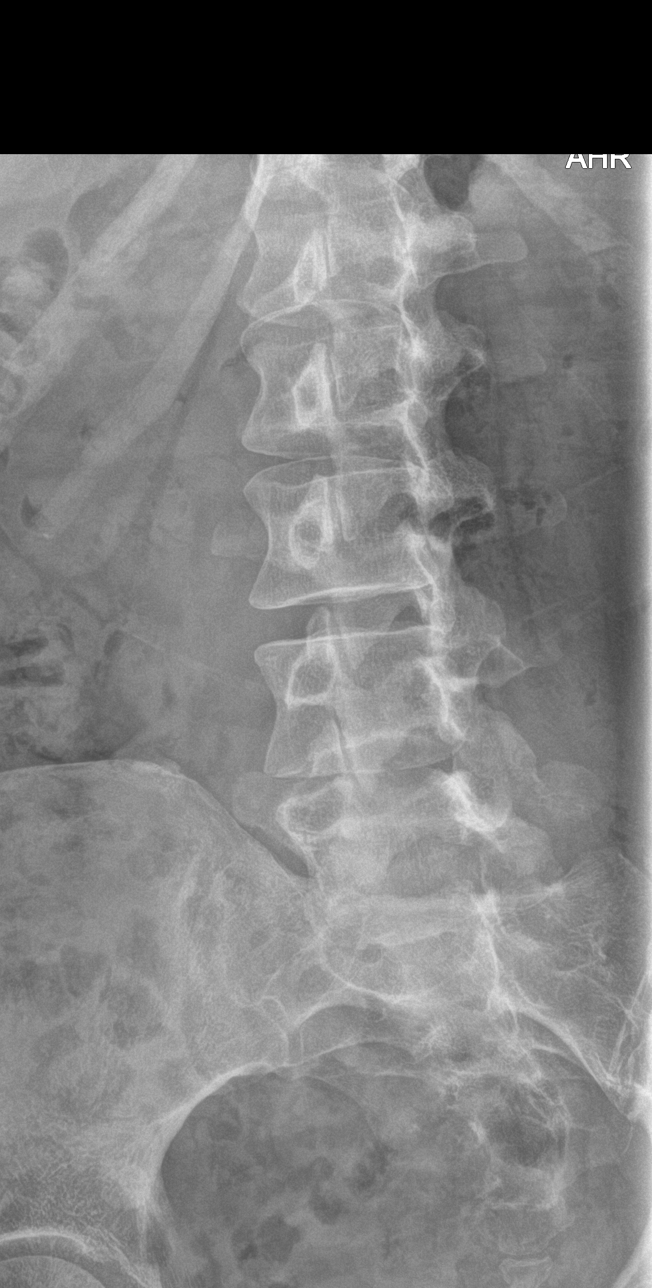

[l-spine obl (2 of 2)]
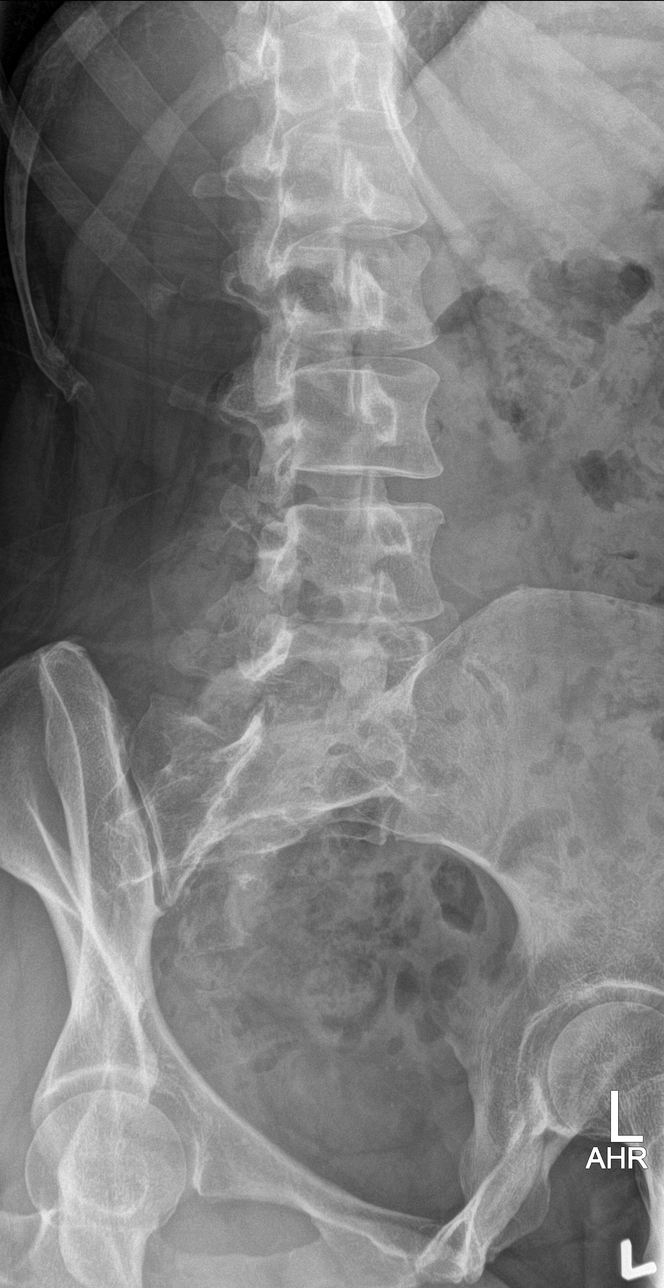

[l-spine lat]
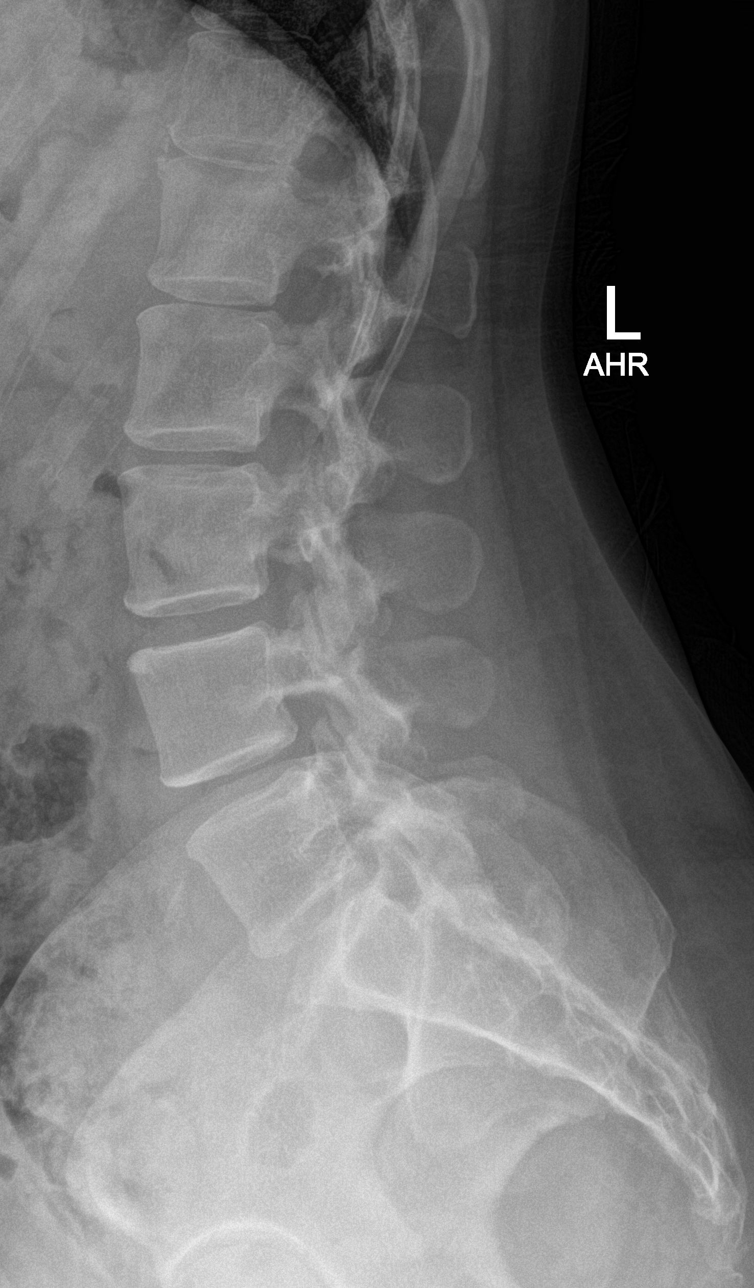

[l-spine spot]
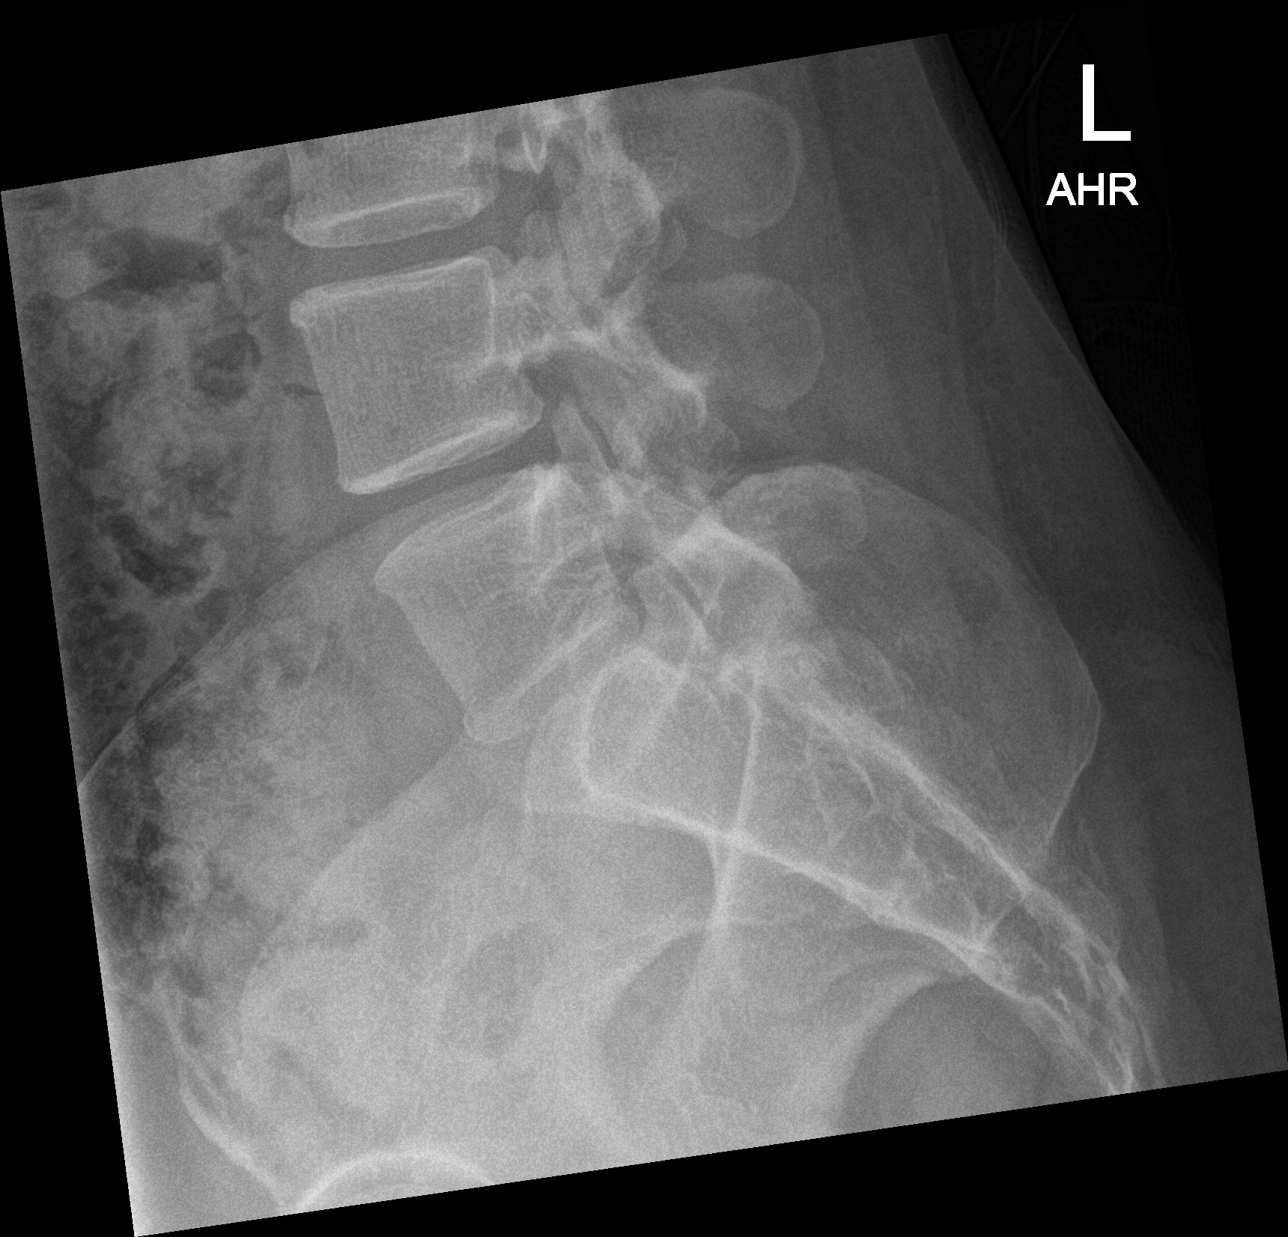

[5 of 5 positions shown; findings below may reference images not displayed]

FINDINGS: There is no evidence of lumbar spine fracture. Alignment is normal.
Intervertebral disc spaces are maintained.
IMPRESSION: Negative.

## 2020-08-13 ENCOUNTER — Other Ambulatory Visit: Payer: Self-pay | Admitting: Family Medicine

## 2020-08-13 DIAGNOSIS — J4520 Mild intermittent asthma, uncomplicated: Secondary | ICD-10-CM

## 2020-08-13 NOTE — Telephone Encounter (Signed)
Call to patient- appointment scheduled. Courtesy RF given 

## 2020-08-19 ENCOUNTER — Encounter: Payer: Self-pay | Admitting: Family Medicine

## 2020-08-19 ENCOUNTER — Other Ambulatory Visit: Payer: Self-pay

## 2020-08-19 ENCOUNTER — Ambulatory Visit (INDEPENDENT_AMBULATORY_CARE_PROVIDER_SITE_OTHER): Payer: Commercial Managed Care - PPO | Admitting: Family Medicine

## 2020-08-19 VITALS — BP 128/78 | HR 71 | Temp 97.1°F | Resp 16 | Ht 62.0 in | Wt 143.0 lb

## 2020-08-19 DIAGNOSIS — F419 Anxiety disorder, unspecified: Secondary | ICD-10-CM

## 2020-08-19 DIAGNOSIS — Z0000 Encounter for general adult medical examination without abnormal findings: Secondary | ICD-10-CM | POA: Diagnosis not present

## 2020-08-19 DIAGNOSIS — E7800 Pure hypercholesterolemia, unspecified: Secondary | ICD-10-CM | POA: Diagnosis not present

## 2020-08-19 DIAGNOSIS — Z23 Encounter for immunization: Secondary | ICD-10-CM

## 2020-08-19 DIAGNOSIS — F325 Major depressive disorder, single episode, in full remission: Secondary | ICD-10-CM

## 2020-08-19 DIAGNOSIS — J4520 Mild intermittent asthma, uncomplicated: Secondary | ICD-10-CM

## 2020-08-19 DIAGNOSIS — Z1159 Encounter for screening for other viral diseases: Secondary | ICD-10-CM | POA: Diagnosis not present

## 2020-08-19 DIAGNOSIS — E663 Overweight: Secondary | ICD-10-CM | POA: Insufficient documentation

## 2020-08-19 MED ORDER — HYDROXYZINE HCL 25 MG PO TABS: 12.5000 mg | ORAL_TABLET | Freq: Two times a day (BID) | ORAL | 2 refills | Status: DC | PRN

## 2020-08-19 MED ORDER — ALBUTEROL SULFATE HFA 108 (90 BASE) MCG/ACT IN AERS: 1.0000 | INHALATION_SPRAY | Freq: Four times a day (QID) | RESPIRATORY_TRACT | 2 refills | Status: DC | PRN

## 2020-08-19 MED ORDER — FLUTICASONE-SALMETEROL 100-50 MCG/DOSE IN AEPB: INHALATION_SPRAY | RESPIRATORY_TRACT | 3 refills | Status: DC

## 2020-08-19 NOTE — Assessment & Plan Note (Signed)
Check fasting lab, prior elevated LDL

## 2020-08-19 NOTE — Progress Notes (Signed)
Subjective:    Patient ID: Maureen Allen, female    DOB: 06-May-1976, 44 y.o.   MRN: [id number]  Maureen Allen is a 44 y.o. female presenting on 08/19/2020 for Annual Exam (patient was taking Hydroxyzine that was Rx in past by [name] --Anxiety )   HPI   Here for Annual physical and Lab review.  Overweight BMI Hyperlipidemia Wellness She is doing well overall, no new major lifestyle changes She is due for labs, fasting today  Anxiety / Major Depression single episode in complete remission Background history of mother passing away in 12/2017, she has had significant symptoms mood and anxiety, with depression and grief reaction. Treated on SSRI has done well. Has good support system with her family.  Interval update - she came off Paxil in past 6 months, has no longer needed, she phased out of it and feels like she does not need it any more.  - Today reports that overall since last visit, her mood and anxiety are gradually improving with time since her mother's passing. She is managing this fairly well. She describes anxiety as her primary symptom that seems to trigger other feels of grief and mood at times. See scores below for details ROS   Health Maintenance:  Due for Flu Shot, will receive today   Colon CA Screening: Last Colonoscopy (done by [facility] Dr Tobi Bastos), diagnostic due to change in stool caliber and back pain, results with normal colon without polyp or abnormality, good for 10 years. Currently asymptomatic. No known family history of colon CA.  - Next due colonoscopy age 63 - 10 years (approx 2030)  Due for pap smear, she will return to OBGYN in [place] at [facility] clinic, will send [place] copy of pap smear  - Also due Mammogram from GYN   Depression screen [facility] 2/9 08/19/2020 03/23/2019 02/28/2018  Decreased Interest 0 1 1  Down, Depressed, Hopeless 0 1 0  PHQ - 2 Score 0 2 1  Altered sleeping 1 1 0  Tired, decreased energy 1 1 0  Change in appetite 0 0 0  Feeling bad or failure  about yourself  0 0 0  Trouble concentrating 0 0 1  Moving slowly or fidgety/restless 1 1 0  Suicidal thoughts 0 0 0  PHQ-9 Score 3 5 2   Difficult doing work/chores Somewhat difficult Somewhat difficult Somewhat difficult   GAD 7 : Generalized Anxiety Score 08/19/2020 03/23/2019 02/28/2018 01/17/2018  Nervous, Anxious, on Edge 1 1 2 1   Control/stop worrying 1 1 1 1   Worry too much - different things 0 1 1 1   Trouble relaxing 1 0 0 1  Restless 2 1 0 1  Easily annoyed or irritable 2 1 2 1   Afraid - awful might happen 0 0 1 1  Total GAD 7 Score 7 5 7 7   Anxiety Difficulty Somewhat difficult Somewhat difficult Somewhat difficult Extremely difficult      Past Medical History:  Diagnosis Date  . Allergy    oak leaves fall, blooming  . Anxiety    occasionally has panic attack due to large extra stressor  . Asthma   . Pre-eclampsia    Past Surgical History:  Procedure Laterality Date  . COLONOSCOPY WITH PROPOFOL N/A 12/23/2018   Procedure: COLONOSCOPY WITH PROPOFOL;  Surgeon: , MD;  Location: [facility] ENDOSCOPY;  Service: Gastroenterology;  Laterality: N/A;  . MOUTH SURGERY  1993   Social History   Socioeconomic History  . Marital status: Married    Spouse  name: Maureen Allen  . Number of children: Not on file  . Years of education: Not on file  . Highest education level: Not on file  Occupational History  . 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 Smoking status: Former Smoker    Quit date: 11/24/1999    Years since quitting: 20.7  . Smokeless tobacco: Never Used  Vaping Use  . Vaping Use: Never used  Substance and Sexual Activity  . Alcohol use: Yes    Alcohol/week: 1.0 standard drink    Types: 1 Standard drinks or equivalent per week  . Drug use: No  . Sexual activity: Yes    Birth control/protection: Surgical    Comment: partner vasectomy  Other Topics Concern  . Not on file  Social History Narrative  . Not on file   Social Determinants of Health    Financial Resource Strain:   . Difficulty of Paying Living Expenses: Not on file  Food Insecurity:   . Worried About Programme researcher, broadcasting/film/video in the Last Year: Not on file  . Ran Out of Food in the Last Year: Not on file  Transportation Needs:   . Lack of Transportation (Medical): Not on file  . Lack of Transportation (Non-Medical): Not on file  Physical Activity:   . Days of Exercise per Week: Not on file  . Minutes of Exercise per Session: Not on file  Stress:   . Feeling of Stress : Not on file  Social Connections:   . Frequency of Communication with Friends and Family: Not on file  . Frequency of Social Gatherings with Friends and Family: Not on file  . Attends Religious Services: Not on file  . Active Member of Clubs or Organizations: Not on file  . Attends Banker Meetings: Not on file  . Marital Status: Not on file  Intimate Partner Violence:   . Fear of Current or Ex-Partner: Not on file  . Emotionally Abused: Not on file  . Physically Abused: Not on file  . Sexually Abused: Not on file   Family History  Adopted: Yes  Problem Relation Age of Onset  . Healthy Son    Current Outpatient Medications on File Prior to Visit  Medication Sig  . loratadine (CLARITIN) 10 MG tablet Take 10 mg by mouth daily.   No current facility-administered medications on file prior to visit.    Review of Systems  Constitutional: Negative for activity change, appetite change, chills, diaphoresis, fatigue and fever.  HENT: Negative for congestion and hearing loss.   Eyes: Negative for visual disturbance.  Respiratory: Negative for cough, chest tightness, shortness of breath and wheezing.   Cardiovascular: Negative for chest pain, palpitations and leg swelling.  Gastrointestinal: Negative for abdominal pain, constipation, diarrhea, nausea and vomiting.  Endocrine: Negative for cold intolerance.  Genitourinary: Negative for difficulty urinating, dysuria, frequency and hematuria.   Musculoskeletal: Negative for arthralgias and neck pain.  Skin: Negative for rash.  Allergic/Immunologic: Negative for environmental allergies.  Neurological: Negative for dizziness, weakness, light-headedness, numbness and headaches.  Hematological: Negative for adenopathy.  Psychiatric/Behavioral: Negative for behavioral problems, dysphoric mood and sleep disturbance. The patient is nervous/anxious.    Per HPI unless specifically indicated above     Objective:    BP 128/78   Pulse 71   Temp (!) 97.1 F (36.2 C) (Temporal)   Resp 16   Ht 5\' 2"  (1.575 m)   Wt 143 lb (64.9 kg)   SpO2 100%  BMI 26.16 kg/m   Wt Readings from Last 3 Encounters:  08/19/20 143 lb (64.9 kg)  12/23/18 135 lb (61.2 kg)  11/04/18 141 lb 3.2 oz (64 kg)    Physical Exam Vitals and nursing note reviewed.  Constitutional:      General: She is not in acute distress.    Appearance: She is well-developed. She is not diaphoretic.     Comments: Well-appearing, comfortable, cooperative  HENT:     Head: Normocephalic and atraumatic.  Eyes:     General:        Right eye: No discharge.        Left eye: No discharge.     Conjunctiva/sclera: Conjunctivae normal.     Pupils: Pupils are equal, round, and reactive to light.  Neck:     Thyroid: No thyromegaly.     Vascular: No carotid bruit.  Cardiovascular:     Rate and Rhythm: Normal rate and regular rhythm.     Heart sounds: Normal heart sounds. No murmur heard.   Pulmonary:     Effort: Pulmonary effort is normal. No respiratory distress.     Breath sounds: Normal breath sounds. No wheezing or rales.  Abdominal:     General: Bowel sounds are normal. There is no distension.     Palpations: Abdomen is soft. There is no mass.     Tenderness: There is no abdominal tenderness.  Musculoskeletal:        General: No tenderness. Normal range of motion.     Cervical back: Normal range of motion and neck supple.     Comments: Upper / Lower Extremities: -  Normal muscle tone, strength bilateral upper extremities 5/5, lower extremities 5/5  Lymphadenopathy:     Cervical: No cervical adenopathy.  Skin:    General: Skin is warm and dry.     Findings: No erythema or rash.  Neurological:     Mental Status: She is alert and oriented to person, place, and time.     Comments: Distal sensation intact to light touch all extremities  Psychiatric:        Behavior: Behavior normal.     Comments: Well groomed, good eye contact, normal speech and thoughts    Results for orders placed or performed in visit on 08/19/20  HM HIV SCREENING LAB  Result Value Ref Range   HM HIV Screening Negative - Validated       Assessment & Plan:   Problem List Items Addressed This Visit    Pure hypercholesterolemia    Check fasting lab, prior elevated LDL      Relevant Orders   Hemoglobin A1c   COMPLETE METABOLIC PANEL WITH GFR   Lipid panel   TSH   Overweight (BMI 25.0-29.9)    Lifestyle diet exercise goals      Major depression, single episode, in complete remission (HCC)    Resolved, complete remission OFF SSRI Paxil Primary trigger was passing of mother 2+ year ago      Relevant Medications   hydrOXYzine (ATARAX/VISTARIL) 25 MG tablet   Asthma    Stable, chronic mild intermittent without flare Refill Advair, and Albuterol PRN      Relevant Medications   Fluticasone-Salmeterol (ADVAIR DISKUS) 100-50 MCG/DOSE AEPB   albuterol (VENTOLIN HFA) 108 (90 Base) MCG/ACT inhaler   Anxiety    Improved overall Still has some episodic flares and panic with her anxiety Gradually improving fairly generalized anxiety - triggered by grief / loss of mother little over 1 year  ago, associated with episode of depression/prolong grief OFF SSRI Paxil  Plan: 1. REORDER Hydroxyzine 25mg  BID PRN can use half or whole tab - infrequent use, counseling on benefit/risk side effect 2. Continue staying active, and utilizing her support system      Relevant Medications     hydrOXYzine (ATARAX/VISTARIL) 25 MG tablet    Other Visit Diagnoses    Annual physical exam    -  Primary   Relevant Orders   Hemoglobin A1c   CBC with Differential/Platelet   COMPLETE METABOLIC PANEL WITH GFR   Lipid panel   TSH   Needs flu shot       Relevant Orders   Flu Vaccine QUAD 36+ mos IM (Completed)   Need for hepatitis C screening test       Relevant Orders   Hepatitis C antibody      Updated Health Maintenance information - next colonoscopy 2030 - due pap smear / mammogram, will return to [facility], send [facility] copy of results. - Due Flu Shot today Reviewed recent lab results with patient Encouraged improvement to lifestyle with diet and exercise Maintain healthy weight    Meds ordered this encounter  Medications  . hydrOXYzine (ATARAX/VISTARIL) 25 MG tablet    Sig: Take 0.5-1 tablets (12.5-25 mg total) by mouth 2 (two) times daily as needed for anxiety.    Dispense:  30 tablet    Refill:  2  . Fluticasone-Salmeterol (ADVAIR DISKUS) 100-50 MCG/DOSE AEPB    Sig: TAKE 1 PUFF BY MOUTH TWICE A DAY    Dispense:  180 each    Refill:  3  . albuterol (VENTOLIN HFA) 108 (90 Base) MCG/ACT inhaler    Sig: Inhale 1-2 puffs into the lungs every 6 (six) hours as needed for wheezing or shortness of breath.    Dispense:  18 each    Refill:  2    Keep refills on file    Follow up plan: Return in about 1 year (around 08/19/2021) for Follow-up 1 year for Annual Physical (fasting lab AFTER apt).  08/21/2021, DO [facility] 08/19/2020, 9:49 AM

## 2020-08-19 NOTE — Patient Instructions (Addendum)
Thank you for coming to the office today.  Please schedule apt with [facility] for Pap Smear testing - have them send [place] a copy of the report.  Also for Mammogram - through GYN. Send [place] copy of report.  --------------------------------------  Flu Shot today  Refilled Hydroxyzine and Advair and added refill to Albuterol  Blood work today.   Please schedule a Follow-up Appointment to: Return in about 1 year (around 08/19/2021) for Follow-up 1 year for Annual Physical (fasting lab AFTER apt).  If you have any other questions or concerns, please feel free to call the office or send a message through MyChart. You may also schedule an earlier appointment if necessary.  Additionally, you may be receiving a survey about your experience at our office within a few days to 1 week by e-mail or mail. We value your feedback.  Saralyn Pilar, DO [facility], [place]

## 2020-08-19 NOTE — Assessment & Plan Note (Signed)
Resolved, complete remission OFF SSRI Paxil Primary trigger was passing of mother 2+ year ago

## 2020-08-19 NOTE — Assessment & Plan Note (Signed)
Improved overall Still has some episodic flares and panic with her anxiety Gradually improving fairly generalized anxiety - triggered by grief / loss of mother little over 1 year ago, associated with episode of depression/prolong grief OFF SSRI Paxil  Plan: 1. REORDER Hydroxyzine 25mg  BID PRN can use half or whole tab - infrequent use, counseling on benefit/risk side effect 2. Continue staying active, and utilizing her support system

## 2020-08-19 NOTE — Assessment & Plan Note (Signed)
Stable, chronic mild intermittent without flare Refill Advair, and Albuterol PRN

## 2020-08-19 NOTE — Assessment & Plan Note (Signed)
Lifestyle diet exercise goals

## 2020-08-20 LAB — COMPLETE METABOLIC PANEL WITH GFR
AG Ratio: 2.1 (calc) (ref 1.0–2.5)
ALT: 18 U/L (ref 6–29)
AST: 19 U/L (ref 10–30)
Albumin: 4.8 g/dL (ref 3.6–5.1)
Alkaline phosphatase (APISO): 64 U/L (ref 31–125)
BUN: 11 mg/dL (ref 7–25)
CO2: 24 mmol/L (ref 20–32)
Calcium: 9.3 mg/dL (ref 8.6–10.2)
Chloride: 105 mmol/L (ref 98–110)
Creat: 0.72 mg/dL (ref 0.50–1.10)
GFR, Est African American: 118 mL/min/{1.73_m2} (ref >=60)
GFR, Est Non African American: 102 mL/min/{1.73_m2} (ref >=60)
Globulin: 2.3 g/dL (calc) (ref 1.9–3.7)
Glucose, Bld: 85 mg/dL (ref 65–99)
Potassium: 4.1 mmol/L (ref 3.5–5.3)
Sodium: 138 mmol/L (ref 135–146)
Total Bilirubin: 0.5 mg/dL (ref 0.2–1.2)
Total Protein: 7.1 g/dL (ref 6.1–8.1)

## 2020-08-20 LAB — LIPID PANEL
Cholesterol: 226 mg/dL — ABNORMAL HIGH (ref <200)
HDL: 64 mg/dL (ref >=50)
LDL Cholesterol (Calc): 147 mg/dL (calc) — ABNORMAL HIGH
Non-HDL Cholesterol (Calc): 162 mg/dL (calc) — ABNORMAL HIGH (ref <130)
Total CHOL/HDL Ratio: 3.5 (calc) (ref <5.0)
Triglycerides: 62 mg/dL (ref <150)

## 2020-08-20 LAB — CBC WITH DIFFERENTIAL/PLATELET
Absolute Monocytes: 456 cells/uL (ref 200–950)
Basophils Absolute: 91 cells/uL (ref 0–200)
Basophils Relative: 1.6 %
Eosinophils Absolute: 376 cells/uL (ref 15–500)
Eosinophils Relative: 6.6 %
HCT: 43.8 % (ref 35.0–45.0)
Hemoglobin: 14.9 g/dL (ref 11.7–15.5)
Lymphs Abs: 1710 cells/uL (ref 850–3900)
MCH: 30.8 pg (ref 27.0–33.0)
MCHC: 34 g/dL (ref 32.0–36.0)
MCV: 90.5 fL (ref 80.0–100.0)
MPV: 10.2 fL (ref 7.5–12.5)
Monocytes Relative: 8 %
Neutro Abs: 3067 cells/uL (ref 1500–7800)
Neutrophils Relative %: 53.8 %
Platelets: 284 10*3/uL (ref 140–400)
RBC: 4.84 10*6/uL (ref 3.80–5.10)
RDW: 12.7 % (ref 11.0–15.0)
Total Lymphocyte: 30 %
WBC: 5.7 10*3/uL (ref 3.8–10.8)

## 2020-08-20 LAB — HEPATITIS C ANTIBODY
Hepatitis C Ab: NONREACTIVE
SIGNAL TO CUT-OFF: 0.01 (ref <1.00)

## 2020-08-20 LAB — HEMOGLOBIN A1C
Hgb A1c MFr Bld: 5.2 % of total Hgb (ref <5.7)
Mean Plasma Glucose: 103 (calc)
eAG (mmol/L): 5.7 (calc)

## 2020-08-20 LAB — TSH: TSH: 1.17 mIU/L

## 2020-09-02 ENCOUNTER — Other Ambulatory Visit: Payer: Self-pay | Admitting: Family Medicine

## 2020-09-02 DIAGNOSIS — F419 Anxiety disorder, unspecified: Secondary | ICD-10-CM

## 2020-09-02 NOTE — Telephone Encounter (Signed)
Approved per protocol. Exchanged for 90 day supply.

## 2020-11-25 ENCOUNTER — Other Ambulatory Visit: Payer: Self-pay | Admitting: Family Medicine

## 2020-11-25 DIAGNOSIS — F419 Anxiety disorder, unspecified: Secondary | ICD-10-CM

## 2020-11-25 NOTE — Telephone Encounter (Signed)
Requested Prescriptions  Pending Prescriptions Disp Refills  . hydrOXYzine (ATARAX/VISTARIL) 25 MG tablet [Pharmacy Med Name: HYDROXYZINE HCL 25 MG TABLET] 180 tablet 2    Sig: TAKE 0.5-1 TABLETS (12.5-25 MG TOTAL) BY MOUTH 2 (TWO) TIMES DAILY AS NEEDED FOR ANXIETY.     Ear, Nose, and Throat:  Antihistamines Passed - 11/25/2020  8:31 AM      Passed - Valid encounter within last 12 months    Recent Outpatient Visits          3 months ago Annual physical exam   [facility] Smitty Cords, DO   1 year ago Anxiety   [facility] Smitty Cords, DO   2 years ago Colon cancer screening   [facility] Kyung Rudd, Alison Stalling, NP   2 years ago Encounter for annual physical exam   [facility] Kyung Rudd, Alison Stalling, NP   2 years ago Anxiety   [facility] Kyung Rudd, Alison Stalling, NP      Future Appointments            In 9 months Althea Charon, [name], DO [facility], [facility]

## 2021-01-09 ENCOUNTER — Other Ambulatory Visit: Payer: Self-pay | Admitting: Family Medicine

## 2021-01-09 DIAGNOSIS — J4520 Mild intermittent asthma, uncomplicated: Secondary | ICD-10-CM

## 2021-03-10 ENCOUNTER — Other Ambulatory Visit: Payer: Self-pay | Admitting: Family Medicine

## 2021-03-10 DIAGNOSIS — J4520 Mild intermittent asthma, uncomplicated: Secondary | ICD-10-CM

## 2021-03-10 NOTE — Telephone Encounter (Signed)
   Notes to clinic:  Alternative Requested:FORMULARY CHANGE, INSURANCE PREFERS LEVALBUTEROL   Requested Prescriptions  Pending Prescriptions Disp Refills   levalbuterol (XOPENEX HFA) 45 MCG/ACT inhaler [Pharmacy Med Name: LEVALBUTEROL TAR HFA INH]  0      Pulmonology:  Beta Agonists Failed - 03/10/2021  9:56 AM      Failed - One inhaler should last at least one month. If the patient is requesting refills earlier, contact the patient to check for uncontrolled symptoms.      Passed - Valid encounter within last 12 months    Recent Outpatient Visits           6 months ago Annual physical exam   [facility] Smitty Cords, DO   1 year ago Anxiety   [facility] Smitty Cords, DO   2 years ago Colon cancer screening   [facility] Kyung Rudd, Alison Stalling, NP   2 years ago Encounter for annual physical exam   [facility] Kyung Rudd, Alison Stalling, NP   3 years ago Anxiety   [facility] Kyung Rudd, Alison Stalling, NP       Future Appointments             In 5 months Althea Charon, [name], DO [facility], [facility]

## 2021-03-19 ENCOUNTER — Ambulatory Visit: Payer: Self-pay | Admitting: *Deleted

## 2021-03-19 NOTE — Telephone Encounter (Signed)
Copied from CRM [id number]. Topic: General - Call Back - No Documentation >> Mar 19, 2021 12:53 PM Randol Kern wrote: Pt called and reported that she has tested positive for covid, has symptoms (is scheduled for VV) but wants advice for quarantine guidelines. Please advise  [phone]    The pt was notified that it is very important remain apart from other people, if possible, even people living in her home for 5 days. After 5 days she need to continue to wear a mask.

## 2021-03-21 ENCOUNTER — Telehealth (INDEPENDENT_AMBULATORY_CARE_PROVIDER_SITE_OTHER): Payer: Commercial Managed Care - PPO | Admitting: Family Medicine

## 2021-03-21 ENCOUNTER — Other Ambulatory Visit: Payer: Self-pay

## 2021-03-21 ENCOUNTER — Encounter: Payer: Self-pay | Admitting: Family Medicine

## 2021-03-21 VITALS — Ht 62.0 in

## 2021-03-21 DIAGNOSIS — U071 COVID-19: Secondary | ICD-10-CM | POA: Diagnosis not present

## 2021-03-21 NOTE — Patient Instructions (Addendum)
Follow up if not improved. Keep in touch.  Calculating Isolation Day 0 is your first day of symptoms or a positive viral test. Day 1 is the first full day after your symptoms developed or your test specimen was collected. If you have COVID-19 or have symptoms, isolate for at least 5 days.  IF YOU Tested positive for COVID-19 or have symptoms, regardless of vaccination status  Stay home for at least 5 days Stay home for 5 days and isolate from others in your home.  Wear a well-fitting mask if you must be around others in your home.  Do not travel.  Ending isolation if you had symptoms End isolation after 5 full days if you are fever-free for 24 hours (without the use of fever-reducing medication) and your symptoms are improving.  Ending isolation if you did NOT have symptoms End isolation after at least 5 full days after your positive test.  If you got very sick from COVID-19 or have a weakened immune system You should isolate for at least 10 days. Consult your doctor before ending isolation.  Take precautions until day 10  Wear a well-fitting mask  Wear a well-fitting mask for 10 full days any time you are around others inside your home or in public. Do not go to places where you are unable to wear a mask.  Do not travel Do not travel until a full 10 days after your symptoms started or the date your positive test was taken if you had no symptoms.  Avoid being around people who are more likely to get very sick from COVID-19.   Please schedule a Follow-up Appointment to: Return if symptoms worsen or fail to improve.  If you have any other questions or concerns, please feel free to call the office or send a message through MyChart. You may also schedule an earlier appointment if necessary.  Additionally, you may be receiving a survey about your experience at our office within a few days to 1 week by e-mail or mail. We value your feedback.  Saralyn Pilar, DO [facility], [place]

## 2021-03-21 NOTE — Progress Notes (Signed)
Subjective:    Patient ID: Maureen Allen, female    DOB: [birth year], 45 y.o.   MRN: [id number]  Maureen Allen is a 45 y.o. female presenting on 03/21/2021 for Covid Positive (Tested positive 4/27 symptoms started sore throat 4/22, (at home test) runny nose, body aches, chills no fever)  Virtual / Telehealth Encounter - Video Visit via MyChart The purpose of this virtual visit is to provide medical care while limiting exposure to the novel coronavirus (COVID19) for both patient and office staff.  Consent was obtained for remote visit:  Yes.   Answered questions that patient had about telehealth interaction:  Yes.   I discussed the limitations, risks, security and privacy concerns of performing an evaluation and management service by video/telephone. I also discussed with the patient that there may be a patient responsible charge related to this service. The patient expressed understanding and agreed to proceed.  Patient Location: Home Provider Location: [facility] (Office)  Participants in virtual visit: - Patient: [name] - CMA: Burnell Blanks, CMA - Provider: Dr Althea Charon   HPI   COVID Positive Initially she had some mild symptoms sore throat sinus drainage allergy related it seemed at first, while in [place], then later found out sick contacts, she was in [place], people she was traveling with that her travel partners were positive, later found out. - She had onset symptoms initially 03/14/21 and then tested positive on home test 03/19/21 - Still has sinus drainage, sore throat, body ache and chill. Taking ibuprofen / tylenol PRN - Taking OTC Claritin - Admits foggy overall - Admits some muscle aches and twitchy - Denies any fever, nausea vomiting, diarrhea   Depression screen [facility] 2/9 03/21/2021 08/19/2020 03/23/2019  Decreased Interest 0 0 1  Down, Depressed, Hopeless 0 0 1  PHQ - 2 Score 0 0 2  Altered sleeping 0 1 1  Tired, decreased energy 0 1 1  Change in appetite 0 0  0  Feeling bad or failure about yourself  0 0 0  Trouble concentrating 0 0 0  Moving slowly or fidgety/restless 0 1 1  Suicidal thoughts 0 0 0  PHQ-9 Score 0 3 5  Difficult doing work/chores - Somewhat difficult Somewhat difficult    Social History   Tobacco Use  . Smoking status: Former Smoker    Quit date: 11/24/1999    Years since quitting: 21.3  . Smokeless tobacco: Never Used  Vaping Use  . Vaping Use: Never used  Substance Use Topics  . Alcohol use: Yes    Alcohol/week: 1.0 standard drink    Types: 1 Standard drinks or equivalent per week  . Drug use: No    Review of Systems Per HPI unless specifically indicated above     Objective:    Ht 5\' 2"  (1.575 m)   BMI 26.16 kg/m   Wt Readings from Last 3 Encounters:  08/19/20 143 lb (64.9 kg)  12/23/18 135 lb (61.2 kg)  11/04/18 141 lb 3.2 oz (64 kg)    Physical Exam   Note examination was completely remotely via video observation objective data only  Gen - well-appearing, no acute distress or apparent pain, comfortable HEENT - eyes appear clear without discharge or redness Heart/Lungs - cannot examine virtually - observed no evidence of coughing or labored breathing. Abd - cannot examine virtually  Skin - face visible today- no rash Neuro - awake, alert, oriented Psych - not anxious appearing    Results for orders placed  or performed in visit on 08/19/20  HM HIV SCREENING LAB  Result Value Ref Range   HM HIV Screening Negative - Validated   Hemoglobin A1c  Result Value Ref Range   Hgb A1c MFr Bld 5.2 <5.7 % of total Hgb   Mean Plasma Glucose 103 (calc)   eAG (mmol/L) 5.7 (calc)  CBC with Differential/Platelet  Result Value Ref Range   WBC 5.7 3.8 - 10.8 Thousand/uL   RBC 4.84 3.80 - 5.10 Million/uL   Hemoglobin 14.9 11.7 - 15.5 g/dL   HCT 01.0 27.2 - 53.6 %   MCV 90.5 80.0 - 100.0 fL   MCH 30.8 27.0 - 33.0 pg   MCHC 34.0 32.0 - 36.0 g/dL   RDW 64.4 03.4 - 74.2 %   Platelets 284 140 - 400  Thousand/uL   MPV 10.2 7.5 - 12.5 fL   Neutro Abs 3,067 1,500 - 7,800 cells/uL   Lymphs Abs 1,710 850 - 3,900 cells/uL   Absolute Monocytes 456 200 - 950 cells/uL   Eosinophils Absolute 376 15 - 500 cells/uL   Basophils Absolute 91 0 - 200 cells/uL   Neutrophils Relative % 53.8 %   Total Lymphocyte 30.0 %   Monocytes Relative 8.0 %   Eosinophils Relative 6.6 %   Basophils Relative 1.6 %  COMPLETE METABOLIC PANEL WITH GFR  Result Value Ref Range   Glucose, Bld 85 65 - 99 mg/dL   BUN 11 7 - 25 mg/dL   Creat 5.95 6.38 - 7.56 mg/dL   GFR, Est Non African American 102 > OR = 60 mL/min/1.80m2   GFR, Est African American 118 > OR = 60 mL/min/1.51m2   BUN/Creatinine Ratio NOT APPLICABLE 6 - 22 (calc)   Sodium 138 135 - 146 mmol/L   Potassium 4.1 3.5 - 5.3 mmol/L   Chloride 105 98 - 110 mmol/L   CO2 24 20 - 32 mmol/L   Calcium 9.3 8.6 - 10.2 mg/dL   Total Protein 7.1 6.1 - 8.1 g/dL   Albumin 4.8 3.6 - 5.1 g/dL   Globulin 2.3 1.9 - 3.7 g/dL (calc)   AG Ratio 2.1 1.0 - 2.5 (calc)   Total Bilirubin 0.5 0.2 - 1.2 mg/dL   Alkaline phosphatase (APISO) 64 31 - 125 U/L   AST 19 10 - 30 U/L   ALT 18 6 - 29 U/L  Lipid panel  Result Value Ref Range   Cholesterol 226 (H) <200 mg/dL   HDL 64 > OR = 50 mg/dL   Triglycerides 62 <433 mg/dL   LDL Cholesterol (Calc) 147 (H) mg/dL (calc)   Total CHOL/HDL Ratio 3.5 <5.0 (calc)   Non-HDL Cholesterol (Calc) 162 (H) <130 mg/dL (calc)  TSH  Result Value Ref Range   TSH 1.17 mIU/L  Hepatitis C antibody  Result Value Ref Range   Hepatitis C Ab NON-REACTIVE NON-REACTI   SIGNAL TO CUT-OFF 0.01 <1.00      Assessment & Plan:   Problem List Items Addressed This Visit    COVID-19 virus infection - Primary      COVID19 infection Mild course Uncomplicated Not involving breathing or asthma Uncertain if still febrile, on Ibuprofen still. Symptom start 03/14/21 Date of positive test 03/19/21 She is following quarantine protocol Continue OTC therapy  supportive care No new rx needed at this time. Follow-up if unresolved or not improving as predicted.  No orders of the defined types were placed in this encounter.     Follow up plan: Return if symptoms worsen  or fail to improve.    Patient verbalizes understanding with the above medical recommendations including the limitation of remote medical advice.  Specific follow-up and call-back criteria were given for patient to follow-up or seek medical care more urgently if needed.  Total duration of direct patient care provided via video conference: 10 minutes    Saralyn Pilar, DO [facility] 03/21/2021, 11:14 AM

## 2021-06-18 ENCOUNTER — Other Ambulatory Visit: Payer: Self-pay | Admitting: Family Medicine

## 2021-06-18 DIAGNOSIS — J4520 Mild intermittent asthma, uncomplicated: Secondary | ICD-10-CM

## 2021-08-22 ENCOUNTER — Encounter: Payer: Commercial Managed Care - PPO | Admitting: Family Medicine

## 2021-09-05 ENCOUNTER — Encounter: Payer: Self-pay | Admitting: Family Medicine

## 2021-09-05 ENCOUNTER — Other Ambulatory Visit: Payer: Self-pay

## 2021-09-05 ENCOUNTER — Ambulatory Visit (INDEPENDENT_AMBULATORY_CARE_PROVIDER_SITE_OTHER): Payer: Commercial Managed Care - PPO | Admitting: Family Medicine

## 2021-09-05 VITALS — BP 122/84 | HR 101 | Ht 62.0 in | Wt 134.6 lb

## 2021-09-05 DIAGNOSIS — Z23 Encounter for immunization: Secondary | ICD-10-CM | POA: Diagnosis not present

## 2021-09-05 DIAGNOSIS — E7800 Pure hypercholesterolemia, unspecified: Secondary | ICD-10-CM

## 2021-09-05 DIAGNOSIS — Z1231 Encounter for screening mammogram for malignant neoplasm of breast: Secondary | ICD-10-CM | POA: Diagnosis not present

## 2021-09-05 DIAGNOSIS — F325 Major depressive disorder, single episode, in full remission: Secondary | ICD-10-CM

## 2021-09-05 DIAGNOSIS — Z0000 Encounter for general adult medical examination without abnormal findings: Secondary | ICD-10-CM

## 2021-09-05 DIAGNOSIS — F419 Anxiety disorder, unspecified: Secondary | ICD-10-CM

## 2021-09-05 NOTE — Patient Instructions (Addendum)
Thank you for coming to the office today.  Reminder to go back to prior OB GYN in [place]. Please have them fax [place] a copy of the pap smear results when you do this.  ---------------------------------  For Mammogram screening for breast cancer  - you will likely need to sign release forms for your previous imaging.  Call the Imaging Center below anytime to schedule your own appointment now that order has been placed.  [facility] [address] [place], [place] [address] Phone: [phone]  --- Flu Shot today  COVID Booster omicron variant when ready at pharmacy.  DUE for FASTING BLOOD WORK (no food or drink after midnight before the lab appointment, only water or coffee without cream/sugar on the morning of)  SCHEDULE "Lab Only" visit in the morning at the clinic for lab draw in 1 YEAR  - Make sure Lab Only appointment is at about 1 week before your next appointment, so that results will be available  For Lab Results, once available within 2-3 days of blood draw, you can can log in to MyChart online to view your results and a brief explanation. Also, we can discuss results at next follow-up visit.   Please schedule a Follow-up Appointment to: Return in about 1 year (around 09/05/2022) for 1 year Annual Physical AM apt fasting lab AFTER.  If you have any other questions or concerns, please feel free to call the office or send a message through MyChart. You may also schedule an earlier appointment if necessary.  Additionally, you may be receiving a survey about your experience at our office within a few days to 1 week by e-mail or mail. We value your feedback.  Saralyn Pilar, DO [facility], [place]

## 2021-09-05 NOTE — Progress Notes (Signed)
Subjective:    Patient ID: Maureen Allen, female    DOB: [birth year], 45 y.o.   MRN: [id number]  Maureen Allen is a 45 y.o. female presenting on 09/05/2021 for Annual Exam   HPI  Here for Annual physical and Lab review.   Overweight BMI Hyperlipidemia Wellness She is doing well overall, no new major lifestyle changes She is due for labs, fasting today   Anxiety / Major Depression single episode in complete remission Background history of mother passing away in 12/2017, she has had significant symptoms mood and anxiety, with depression and grief reaction. Treated on SSRI has done well. previously Has good support system with her family.   Interval update - she remains off Paxil for about 1.5 years now still.  - Today reports that overall since last visit, her mood and anxiety are still  improving with time since her mother's passing. She is managing this fairly well. She describes anxiety as her primary symptom that seems to trigger other feels of grief and mood at times. See scores below for details ROS   Additional concern Left Shoulder muscle strain spasm Has tried Tylenol PRN Massage therapist with some relief. Tried to work on pressure points. Has used theracane   Health Maintenance:   Due for Flu Shot, will receive today   Colon CA Screening: Last Colonoscopy (done by [facility] Dr Tobi Bastos), diagnostic due to change in stool caliber and back pain, results with normal colon without polyp or abnormality, good for 10 years. Currently asymptomatic. No known family history of colon CA.  - Next due colonoscopy age 4 - 10 years (approx 2030)   Due for pap smear, she will return to OBGYN in [place] at [facility], will send [place] copy of pap smear   Order mammogram screening locally at [facility], 3D, will request prior images and release to them when she contacts them.  Depression screen [facility] 2/9 09/05/2021 03/21/2021 08/19/2020  Decreased Interest 0 0 0  Down, Depressed, Hopeless  0 0 0  PHQ - 2 Score 0 0 0  Altered sleeping 0 0 1  Tired, decreased energy 0 0 1  Change in appetite 0 0 0  Feeling bad or failure about yourself  0 0 0  Trouble concentrating 0 0 0  Moving slowly or fidgety/restless 0 0 1  Suicidal thoughts 0 0 0  PHQ-9 Score 0 0 3  Difficult doing work/chores Not difficult at all - Somewhat difficult   GAD 7 : Generalized Anxiety Score 09/05/2021 03/21/2021 08/19/2020 03/23/2019  Nervous, Anxious, on Edge 1 1 1 1   Control/stop worrying 1 1 1 1   Worry too much - different things 0 1 0 1  Trouble relaxing 0 1 1 0  Restless 1 1 2 1   Easily annoyed or irritable 0 1 2 1   Afraid - awful might happen 0 0 0 0  Total GAD 7 Score 3 6 7 5   Anxiety Difficulty Not difficult at all - Somewhat difficult Somewhat difficult      Past Medical History:  Diagnosis Date   Allergy    oak leaves fall, blooming   Anxiety    occasionally has panic attack due to large extra stressor   Asthma    Pre-eclampsia    Past Surgical History:  Procedure Laterality Date   COLONOSCOPY WITH PROPOFOL N/A 12/23/2018   Procedure: COLONOSCOPY WITH PROPOFOL;  Surgeon: , MD;  Location: [facility] ENDOSCOPY;  Service: Gastroenterology;  Laterality: N/A;   MOUTH SURGERY  1993   Social History   Socioeconomic History   Marital status: Married    Spouse name: Malicia Blasdel   Number of children: Not on file   Years of education: Not on file   Highest education level: Not on file  Occupational History   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  Tobacco Use   Smoking status: Former    Types: Cigarettes    Quit date: 11/24/1999    Years since quitting: 21.7   Smokeless tobacco: Never  Vaping Use   Vaping Use: Never used  Substance and Sexual Activity   Alcohol use: Yes    Alcohol/week: 1.0 standard drink    Types: 1 Standard drinks or equivalent per week   Drug use: No   Sexual activity: Yes    Birth control/protection: Surgical    Comment: partner vasectomy  Other Topics  Concern   Not on file  Social History Narrative   Not on file   Social Determinants of Health   Financial Resource Strain: Not on file  Food Insecurity: Not on file  Transportation Needs: Not on file  Physical Activity: Not on file  Stress: Not on file  Social Connections: Not on file  Intimate Partner Violence: Not on file   Family History  Adopted: Yes  Problem Relation Age of Onset   Healthy Son    Current Outpatient Medications on File Prior to Visit  Medication Sig   Fluticasone-Salmeterol (ADVAIR DISKUS) 100-50 MCG/DOSE AEPB TAKE 1 PUFF BY MOUTH TWICE A DAY   hydrOXYzine (ATARAX/VISTARIL) 25 MG tablet TAKE 0.5-1 TABLETS (12.5-25 MG TOTAL) BY MOUTH 2 (TWO) TIMES DAILY AS NEEDED FOR ANXIETY.   Ibuprofen (ADVIL PO) Take by mouth as needed.   levalbuterol (XOPENEX HFA) 45 MCG/ACT inhaler INHALE 1 TO 2 PUFFS BY MOUTH EVERY 6 HOURS AS NEEDED FOR WHEEZE   loratadine (CLARITIN) 10 MG tablet Take 10 mg by mouth daily.   No current facility-administered medications on file prior to visit.    Review of Systems  Constitutional:  Negative for activity change, appetite change, chills, diaphoresis, fatigue and fever.  HENT:  Negative for congestion and hearing loss.   Eyes:  Negative for visual disturbance.  Respiratory:  Negative for cough, chest tightness, shortness of breath and wheezing.   Cardiovascular:  Negative for chest pain, palpitations and leg swelling.  Gastrointestinal:  Negative for abdominal pain, constipation, diarrhea, nausea and vomiting.  Genitourinary:  Negative for dysuria, frequency and hematuria.  Musculoskeletal:  Negative for arthralgias and neck pain.       Left back/shoulder pain spasm  Skin:  Negative for rash.  Neurological:  Negative for dizziness, weakness, light-headedness, numbness and headaches.  Hematological:  Negative for adenopathy.  Psychiatric/Behavioral:  Negative for behavioral problems, dysphoric mood and sleep disturbance.   Per HPI  unless specifically indicated above      Objective:    BP 122/84 (BP Location: Left Arm, Patient Position: Sitting, Cuff Size: Normal)   Pulse (!) 101   Ht 5\' 2"  (1.575 m)   Wt 134 lb 9.6 oz (61.1 kg)   SpO2 98%   BMI 24.62 kg/m   Wt Readings from Last 3 Encounters:  09/05/21 134 lb 9.6 oz (61.1 kg)  08/19/20 143 lb (64.9 kg)  12/23/18 135 lb (61.2 kg)    Physical Exam Vitals and nursing note reviewed.  Constitutional:      General: She is not in acute distress.    Appearance: She is well-developed. She is not diaphoretic.  Comments: Well-appearing, comfortable, cooperative  HENT:     Head: Normocephalic and atraumatic.     Right Ear: Tympanic membrane, ear canal and external ear normal. There is no impacted cerumen.     Left Ear: Tympanic membrane, ear canal and external ear normal. There is no impacted cerumen.  Eyes:     General:        Right eye: No discharge.        Left eye: No discharge.     Conjunctiva/sclera: Conjunctivae normal.     Pupils: Pupils are equal, round, and reactive to light.  Neck:     Thyroid: No thyromegaly.     Vascular: No carotid bruit.  Cardiovascular:     Rate and Rhythm: Normal rate and regular rhythm.     Pulses: Normal pulses.     Heart sounds: Normal heart sounds. No murmur heard. Pulmonary:     Effort: Pulmonary effort is normal. No respiratory distress.     Breath sounds: Normal breath sounds. No wheezing or rales.  Abdominal:     General: Bowel sounds are normal. There is no distension.     Palpations: Abdomen is soft. There is no mass.     Tenderness: There is no abdominal tenderness.  Musculoskeletal:        General: No tenderness. Normal range of motion.     Cervical back: Normal range of motion and neck supple.     Comments: Upper / Lower Extremities: - Normal muscle tone, strength bilateral upper extremities 5/5, lower extremities 5/5  Lymphadenopathy:     Cervical: No cervical adenopathy.  Skin:    General: Skin is  warm and dry.     Findings: Lesion (left forearm distal underside with soft mobile non tender nodular density approx 1 x 1 cm likely lipoma) present. No erythema or rash.  Neurological:     Mental Status: She is alert and oriented to person, place, and time.     Comments: Distal sensation intact to light touch all extremities  Psychiatric:        Mood and Affect: Mood normal.        Behavior: Behavior normal.        Thought Content: Thought content normal.     Comments: Well groomed, good eye contact, normal speech and thoughts    Results for orders placed or performed in visit on 08/19/20  HM HIV SCREENING LAB  Result Value Ref Range   HM HIV Screening Negative - Validated   Hemoglobin A1c  Result Value Ref Range   Hgb A1c MFr Bld 5.2 <5.7 % of total Hgb   Mean Plasma Glucose 103 (calc)   eAG (mmol/L) 5.7 (calc)  CBC with Differential/Platelet  Result Value Ref Range   WBC 5.7 3.8 - 10.8 Thousand/uL   RBC 4.84 3.80 - 5.10 Million/uL   Hemoglobin 14.9 11.7 - 15.5 g/dL   HCT 26.9 48.5 - 46.2 %   MCV 90.5 80.0 - 100.0 fL   MCH 30.8 27.0 - 33.0 pg   MCHC 34.0 32.0 - 36.0 g/dL   RDW 70.3 50.0 - 93.8 %   Platelets 284 140 - 400 Thousand/uL   MPV 10.2 7.5 - 12.5 fL   Neutro Abs 3,067 1,500 - 7,800 cells/uL   Lymphs Abs 1,710 850 - 3,900 cells/uL   Absolute Monocytes 456 200 - 950 cells/uL   Eosinophils Absolute 376 15 - 500 cells/uL   Basophils Absolute 91 0 - 200 cells/uL   Neutrophils Relative %  53.8 %   Total Lymphocyte 30.0 %   Monocytes Relative 8.0 %   Eosinophils Relative 6.6 %   Basophils Relative 1.6 %  COMPLETE METABOLIC PANEL WITH GFR  Result Value Ref Range   Glucose, Bld 85 65 - 99 mg/dL   BUN 11 7 - 25 mg/dL   Creat 0.86 5.78 - 4.69 mg/dL   GFR, Est Non African American 102 > OR = 60 mL/min/1.110m2   GFR, Est African American 118 > OR = 60 mL/min/1.58m2   BUN/Creatinine Ratio NOT APPLICABLE 6 - 22 (calc)   Sodium 138 135 - 146 mmol/L   Potassium 4.1 3.5 -  5.3 mmol/L   Chloride 105 98 - 110 mmol/L   CO2 24 20 - 32 mmol/L   Calcium 9.3 8.6 - 10.2 mg/dL   Total Protein 7.1 6.1 - 8.1 g/dL   Albumin 4.8 3.6 - 5.1 g/dL   Globulin 2.3 1.9 - 3.7 g/dL (calc)   AG Ratio 2.1 1.0 - 2.5 (calc)   Total Bilirubin 0.5 0.2 - 1.2 mg/dL   Alkaline phosphatase (APISO) 64 31 - 125 U/L   AST 19 10 - 30 U/L   ALT 18 6 - 29 U/L  Lipid panel  Result Value Ref Range   Cholesterol 226 (H) <200 mg/dL   HDL 64 > OR = 50 mg/dL   Triglycerides 62 <629 mg/dL   LDL Cholesterol (Calc) 147 (H) mg/dL (calc)   Total CHOL/HDL Ratio 3.5 <5.0 (calc)   Non-HDL Cholesterol (Calc) 162 (H) <130 mg/dL (calc)  TSH  Result Value Ref Range   TSH 1.17 mIU/L  Hepatitis C antibody  Result Value Ref Range   Hepatitis C Ab NON-REACTIVE NON-REACTI   SIGNAL TO CUT-OFF 0.01 <1.00      Assessment & Plan:   Problem List Items Addressed This Visit     Pure hypercholesterolemia   Relevant Orders   COMPLETE METABOLIC PANEL WITH GFR   Lipid panel   TSH   Major depression, single episode, in complete remission (HCC)   Anxiety   Other Visit Diagnoses     Annual physical exam    -  Primary   Relevant Orders   COMPLETE METABOLIC PANEL WITH GFR   Lipid panel   CBC with Differential/Platelet   TSH   Needs flu shot       Relevant Orders   Flu Vaccine QUAD 28mo+IM (Fluarix, Fluzone & Alfiuria Quad PF)   Encounter for screening mammogram for malignant neoplasm of breast       Relevant Orders   MM 3D SCREEN BREAST BILATERAL       Updated Health Maintenance information Mammogram ordered - she will call [facility] and arrange scheduling and release of prior imagese records Previous OBGYN for pap smear update Reviewed recent lab results with patient Encouraged improvement to lifestyle with diet and exercise Goal of weight loss  Major depression in remission Resolved  Anxiety Managed currently doing well.  Additionally Reviewed muscle spasm / trigger point L shoulder lev scap  muscle, reviewed myofascial release and home techniques.   No orders of the defined types were placed in this encounter.     Follow up plan: Return in about 1 year (around 09/05/2022) for 1 year Annual Physical AM apt fasting lab AFTER.  Saralyn Pilar, DO [facility] 09/05/2021, 8:48 AM

## 2021-09-06 LAB — COMPLETE METABOLIC PANEL WITH GFR
AG Ratio: 2 (calc) (ref 1.0–2.5)
ALT: 19 U/L (ref 6–29)
AST: 19 U/L (ref 10–35)
Albumin: 4.5 g/dL (ref 3.6–5.1)
Alkaline phosphatase (APISO): 60 U/L (ref 31–125)
BUN: 10 mg/dL (ref 7–25)
CO2: 25 mmol/L (ref 20–32)
Calcium: 9.4 mg/dL (ref 8.6–10.2)
Chloride: 105 mmol/L (ref 98–110)
Creat: 0.7 mg/dL (ref 0.50–0.99)
Globulin: 2.3 g/dL (calc) (ref 1.9–3.7)
Glucose, Bld: 91 mg/dL (ref 65–139)
Potassium: 4.2 mmol/L (ref 3.5–5.3)
Sodium: 138 mmol/L (ref 135–146)
Total Bilirubin: 0.5 mg/dL (ref 0.2–1.2)
Total Protein: 6.8 g/dL (ref 6.1–8.1)
eGFR: 109 mL/min/{1.73_m2} (ref >=60)

## 2021-09-06 LAB — LIPID PANEL
Cholesterol: 239 mg/dL — ABNORMAL HIGH (ref <200)
HDL: 72 mg/dL (ref >=50)
LDL Cholesterol (Calc): 147 mg/dL (calc) — ABNORMAL HIGH
Non-HDL Cholesterol (Calc): 167 mg/dL (calc) — ABNORMAL HIGH (ref <130)
Total CHOL/HDL Ratio: 3.3 (calc) (ref <5.0)
Triglycerides: 95 mg/dL (ref <150)

## 2021-09-06 LAB — TSH: TSH: 1.53 mIU/L

## 2021-09-06 LAB — CBC WITH DIFFERENTIAL/PLATELET
Absolute Monocytes: 410 cells/uL (ref 200–950)
Basophils Absolute: 80 cells/uL (ref 0–200)
Basophils Relative: 1.6 %
Eosinophils Absolute: 280 cells/uL (ref 15–500)
Eosinophils Relative: 5.6 %
HCT: 42.5 % (ref 35.0–45.0)
Hemoglobin: 13.9 g/dL (ref 11.7–15.5)
Lymphs Abs: 1435 cells/uL (ref 850–3900)
MCH: 29.8 pg (ref 27.0–33.0)
MCHC: 32.7 g/dL (ref 32.0–36.0)
MCV: 91.2 fL (ref 80.0–100.0)
MPV: 9.8 fL (ref 7.5–12.5)
Monocytes Relative: 8.2 %
Neutro Abs: 2795 cells/uL (ref 1500–7800)
Neutrophils Relative %: 55.9 %
Platelets: 288 10*3/uL (ref 140–400)
RBC: 4.66 10*6/uL (ref 3.80–5.10)
RDW: 12.4 % (ref 11.0–15.0)
Total Lymphocyte: 28.7 %
WBC: 5 10*3/uL (ref 3.8–10.8)

## 2021-09-24 ENCOUNTER — Encounter: Payer: Self-pay | Admitting: Family Medicine

## 2021-09-24 ENCOUNTER — Telehealth: Payer: Commercial Managed Care - PPO | Admitting: Family Medicine

## 2021-09-24 VITALS — Wt 134.0 lb

## 2021-09-24 DIAGNOSIS — J111 Influenza due to unidentified influenza virus with other respiratory manifestations: Secondary | ICD-10-CM

## 2021-09-24 MED ORDER — OSELTAMIVIR PHOSPHATE 75 MG PO CAPS: 75.0000 mg | ORAL_CAPSULE | Freq: Two times a day (BID) | ORAL | 0 refills | Status: DC

## 2021-09-24 MED ORDER — IPRATROPIUM BROMIDE 0.06 % NA SOLN: 2.0000 | Freq: Four times a day (QID) | NASAL | 0 refills | Status: DC

## 2021-09-24 MED ORDER — BENZONATATE 100 MG PO CAPS: 100.0000 mg | ORAL_CAPSULE | Freq: Three times a day (TID) | ORAL | 0 refills | Status: DC | PRN

## 2021-09-24 MED ORDER — XOFLUZA (40 MG DOSE) 2 X 20 MG PO TBPK: 40.0000 mg | ORAL_TABLET | Freq: Once | ORAL | 0 refills | Status: AC

## 2021-09-24 NOTE — Progress Notes (Signed)
Subjective:    Patient ID: Maureen Allen, female    DOB: 17-May-1976, 45 y.o.   MRN: [id number]  Maureen Allen is a 45 y.o. female presenting on 09/24/2021 for Cough and Generalized Body Aches  Virtual / Telehealth Encounter - Video Visit via MyChart The purpose of this virtual visit is to provide medical care while limiting exposure to the novel coronavirus (COVID19) for both patient and office staff.  Consent was obtained for remote visit:  Yes.   Answered questions that patient had about telehealth interaction:  Yes.   I discussed the limitations, risks, security and privacy concerns of performing an evaluation and management service by video/telephone. I also discussed with the patient that there may be a patient responsible charge related to this service. The patient expressed understanding and agreed to proceed.  Patient Location: Home Provider Location: [facility] (Office)  Participants in virtual visit: - Patient: [name] - CMA: Orinda Kenner, CMA - Provider: Dr Parks Ranger   HPI  Suspected Influenza Son had fever 4 days ago. COVID test was negative. He felt sick with fever and bodyaches. Pediatrician thought her son has the Flu. Cases prevalent in school. Her symptoms started 2-3 days ago with similar flu like symptoms.  Has had Flu before > 10 yr ago Admits productive cough. Admits fever body ache chills  Has asthma, on inhalers, some cough but not wheezing or tightness in chest.   Health Maintenance: UTD on Flu Shot 08/2021  Depression screen [facility] 2/9 09/05/2021 03/21/2021 08/19/2020  Decreased Interest 0 0 0  Down, Depressed, Hopeless 0 0 0  PHQ - 2 Score 0 0 0  Altered sleeping 0 0 1  Tired, decreased energy 0 0 1  Change in appetite 0 0 0  Feeling bad or failure about yourself  0 0 0  Trouble concentrating 0 0 0  Moving slowly or fidgety/restless 0 0 1  Suicidal thoughts 0 0 0  PHQ-9 Score 0 0 3  Difficult doing work/chores Not difficult at  all - Somewhat difficult    Social History   Tobacco Use   Smoking status: Former    Types: Cigarettes    Quit date: 11/24/1999    Years since quitting: 21.8   Smokeless tobacco: Never  Vaping Use   Vaping Use: Never used  Substance Use Topics   Alcohol use: Yes    Alcohol/week: 1.0 standard drink    Types: 1 Standard drinks or equivalent per week   Drug use: No    Review of Systems Per HPI unless specifically indicated above     Objective:    Wt 134 lb (60.8 kg)   BMI 24.51 kg/m   Wt Readings from Last 3 Encounters:  09/24/21 134 lb (60.8 kg)  09/05/21 134 lb 9.6 oz (61.1 kg)  08/19/20 143 lb (64.9 kg)    Physical Exam  Note examination was completely remotely via video observation objective data only  Gen - well-appearing, no acute distress or apparent pain, comfortable HEENT - eyes appear clear without discharge or redness Heart/Lungs - cannot examine virtually - observed no evidence of coughing or labored breathing. Abd - cannot examine virtually  Skin - face visible today- no rash Neuro - awake, alert, oriented Psych - not anxious appearing  Results for orders placed or performed in visit on 09/05/21  COMPLETE METABOLIC PANEL WITH GFR  Result Value Ref Range   Glucose, Bld 91 65 - 139 mg/dL   BUN 10 7 -  25 mg/dL   Creat 0.70 0.50 - 0.99 mg/dL   eGFR 109 > OR = 60 mL/min/1.33m   BUN/Creatinine Ratio NOT APPLICABLE 6 - 22 (calc)   Sodium 138 135 - 146 mmol/L   Potassium 4.2 3.5 - 5.3 mmol/L   Chloride 105 98 - 110 mmol/L   CO2 25 20 - 32 mmol/L   Calcium 9.4 8.6 - 10.2 mg/dL   Total Protein 6.8 6.1 - 8.1 g/dL   Albumin 4.5 3.6 - 5.1 g/dL   Globulin 2.3 1.9 - 3.7 g/dL (calc)   AG Ratio 2.0 1.0 - 2.5 (calc)   Total Bilirubin 0.5 0.2 - 1.2 mg/dL   Alkaline phosphatase (APISO) 60 31 - 125 U/L   AST 19 10 - 35 U/L   ALT 19 6 - 29 U/L  Lipid panel  Result Value Ref Range   Cholesterol 239 (H) <200 mg/dL   HDL 72 > OR = 50 mg/dL   Triglycerides 95  <150 mg/dL   LDL Cholesterol (Calc) 147 (H) mg/dL (calc)   Total CHOL/HDL Ratio 3.3 <5.0 (calc)   Non-HDL Cholesterol (Calc) 167 (H) <130 mg/dL (calc)  CBC with Differential/Platelet  Result Value Ref Range   WBC 5.0 3.8 - 10.8 Thousand/uL   RBC 4.66 3.80 - 5.10 Million/uL   Hemoglobin 13.9 11.7 - 15.5 g/dL   HCT 42.5 35.0 - 45.0 %   MCV 91.2 80.0 - 100.0 fL   MCH 29.8 27.0 - 33.0 pg   MCHC 32.7 32.0 - 36.0 g/dL   RDW 12.4 11.0 - 15.0 %   Platelets 288 140 - 400 Thousand/uL   MPV 9.8 7.5 - 12.5 fL   Neutro Abs 2,795 1,500 - 7,800 cells/uL   Lymphs Abs 1,435 850 - 3,900 cells/uL   Absolute Monocytes 410 200 - 950 cells/uL   Eosinophils Absolute 280 15 - 500 cells/uL   Basophils Absolute 80 0 - 200 cells/uL   Neutrophils Relative % 55.9 %   Total Lymphocyte 28.7 %   Monocytes Relative 8.2 %   Eosinophils Relative 5.6 %   Basophils Relative 1.6 %  TSH  Result Value Ref Range   TSH 1.53 mIU/L      Assessment & Plan:   Problem List Items Addressed This Visit   None Visit Diagnoses     Influenza    -  Primary   Relevant Medications   XOFLUZA, 40 MG DOSE, 2 x 20 MG TBPK   oseltamivir (TAMIFLU) 75 MG capsule   benzonatate (TESSALON) 100 MG capsule   ipratropium (ATROVENT) 0.06 % nasal spray      Clinically diagnosed influenza despite no confirmatory test, concern for flu still due to significant known exposure to suspected influenza - Duration x 3 days, without complication. Tolerating PO and well hydrated - S/p influenza vaccine this season 08/2021  Plan: 1. Start ANTI VIRAL Flu medicine - EITHER - Tamiflu 762mcapsules BID x 5 days, OR Xofluza 1 dose. 2. Supportive care as advised with NSAID / Tylenol PRN fever/myalgias, improve hydration, may take OTC Cold/Flu meds - Start Tessalon Perls take 1 capsule up to 3 times a day as needed for cough - Start Atrovent nasal spray decongestant 2 sprays in each nostril up to 4 times daily for 7 days  Return criteria given if  significant worsening, consider post-influenza complications, otherwise follow-up if needed   Meds ordered this encounter  Medications   XOFLUZA, 40 MG DOSE, 2 x 20 MG TBPK    Sig:  Take 40 mg by mouth once for 1 dose. For Flu    Dispense:  1 each    Refill:  0    Also prescribed Tamiflu, patient should only pick one of these two options, whichever is preferred / covered.   oseltamivir (TAMIFLU) 75 MG capsule    Sig: Take 1 capsule (75 mg total) by mouth 2 (two) times daily. For 5 days    Dispense:  10 capsule    Refill:  0    Also prescribed Xofluza, patient should only pick one of these two options, whichever is preferred / covered.   benzonatate (TESSALON) 100 MG capsule    Sig: Take 1 capsule (100 mg total) by mouth 3 (three) times daily as needed for cough.    Dispense:  30 capsule    Refill:  0   ipratropium (ATROVENT) 0.06 % nasal spray    Sig: Place 2 sprays into both nostrils 4 (four) times daily. For up to 5-7 days then stop.    Dispense:  15 mL    Refill:  0       Follow up plan: Return if symptoms worsen or fail to improve.  Patient verbalizes understanding with the above medical recommendations including the limitation of remote medical advice.  Specific follow-up and call-back criteria were given for patient to follow-up or seek medical care more urgently if needed.  Total duration of direct patient care provided via video conference: 10 minutes   Nobie Putnam, [facility] 09/24/2021, 4:14 PM

## 2021-09-24 NOTE — Patient Instructions (Addendum)
-   Start Flu medicine today - Based on insurance / cost coverage - either Tamiflu (anti-flu medicine) take one capsule 75mg  twice a day for 5 days OR Xofluza 2 pills back to back at one time (one day and then done)  - For symptom control (these are optional OTC medicines)      - Take Ibuprofen / Advil 400-600mg  every 6-8 hours as needed for fever / muscle aches, and may also take Tylenol 500-1000mg  per dose every 6-8 hours or 3 times a day, can alternate dosing     - May try OTC Mucinex up to 7-10 days then stop  If prescribed for you      - Start Tessalon perls one every 8 hours or 3 times a day as needed for cough      - Start Atrovent nasal spray decongestant 2 sprays in each nostril up to 4 times daily for 7 days  - Wash hands and cover cough very well to avoid spread of infection - Improve hydration with plenty of clear fluids  Call or message in 1-2 days if need Prednisone for asthma.   If significant worsening with poor fluid intake, worsening fever, difficulty breathing due to coughing, worsening body aches, weakness, or other more concerning symptoms difficulty breathing you can seek treatment at Emergency Department. Also if improved flu symptoms and then worsening days to week later with concerns for bronchitis, productive cough fever chills again we may need to check for possible pneumonia that can occur after the flu      Please schedule a Follow-up Appointment to: Return if symptoms worsen or fail to improve.  If you have any other questions or concerns, please feel free to call the office or send a message through MyChart. You may also schedule an earlier appointment if necessary.  Additionally, you may be receiving a survey about your experience at our office within a few days to 1 week by e-mail or mail. We value your feedback.  , DO [facility], [facility]

## 2021-10-02 ENCOUNTER — Other Ambulatory Visit: Payer: Self-pay | Admitting: Family Medicine

## 2021-10-02 DIAGNOSIS — J4520 Mild intermittent asthma, uncomplicated: Secondary | ICD-10-CM

## 2021-10-02 NOTE — Telephone Encounter (Signed)
Requested Prescriptions  Pending Prescriptions Disp Refills  . levalbuterol (XOPENEX HFA) 45 MCG/ACT inhaler [Pharmacy Med Name: LEVALBUTEROL TAR HFA INH] 15 each 2    Sig: INHALE 1 TO 2 PUFFS BY MOUTH EVERY 6 HOURS AS NEEDED FOR WHEEZE     Pulmonology:  Beta Agonists Failed - 10/02/2021  6:46 AM      Failed - One inhaler should last at least one month. If the patient is requesting refills earlier, contact the patient to check for uncontrolled symptoms.      Passed - Valid encounter within last 12 months    Recent Outpatient Visits          1 week ago Influenza   [facility] [place], [name], DO   3 weeks ago Annual physical exam   [facility] Smitty Cords, DO   6 months ago COVID-19 virus infection   [facility] Althe[facility] Charon, [name], DO   1 year ago Annual physical exam   [facility] Smitty Cords, DO   2 years ago Anxiety   [facility] Althea Charon, [name], DO      Future Appointments            In 11 months Althea Charon, [name], DO [facility]

## 2021-10-09 ENCOUNTER — Ambulatory Visit
Admission: RE | Admit: 2021-10-09 | Discharge: 2021-10-09 | Disposition: A | Discharge status: Home / Self Care | Payer: Commercial Managed Care - PPO | Source: Ambulatory Visit | Attending: Family Medicine | Admitting: Family Medicine

## 2021-10-09 ENCOUNTER — Other Ambulatory Visit: Payer: Self-pay

## 2021-10-09 DIAGNOSIS — Z1231 Encounter for screening mammogram for malignant neoplasm of breast: Secondary | ICD-10-CM | POA: Insufficient documentation

## 2021-10-10 ENCOUNTER — Inpatient Hospital Stay
Admission: RE | Admit: 2021-10-10 | Discharge: 2021-10-10 | Disposition: A | Discharge status: Home / Self Care | Payer: Self-pay | Source: Ambulatory Visit | Attending: *Deleted | Admitting: *Deleted

## 2021-10-10 ENCOUNTER — Other Ambulatory Visit: Payer: Self-pay | Admitting: Family Medicine

## 2021-10-10 ENCOUNTER — Other Ambulatory Visit: Payer: Self-pay | Admitting: *Deleted

## 2021-10-10 DIAGNOSIS — J4520 Mild intermittent asthma, uncomplicated: Secondary | ICD-10-CM

## 2021-10-10 DIAGNOSIS — Z1231 Encounter for screening mammogram for malignant neoplasm of breast: Secondary | ICD-10-CM

## 2021-10-10 NOTE — Telephone Encounter (Signed)
Requested medication (s) are due for refill today -yes  Requested medication (s) are on the active medication list -yes  Future visit scheduled -yes  Last refill: 08/19/20 #180 3RF  Notes to clinic: Request RF: expired Rx  Requested Prescriptions  Pending Prescriptions Disp Refills   ADVAIR DISKUS 100-50 MCG/ACT AEPB [Pharmacy Med Name: ADVAIR 100-50 DISKUS] 180 each 3    Sig: INHALE 1 PUFF BY MOUTH TWICE A DAY     Pulmonology:  Combination Products Passed - 10/10/2021  9:42 AM      Passed - Valid encounter within last 12 months    Recent Outpatient Visits           2 weeks ago Influenza   [facility] [place], [name], DO   1 month ago Annual physical exam   [facility] Smitty Cords, DO   6 months ago COVID-19 virus infection   [facility] Althea Charon, [name], DO   1 year ago Annual physical exam   [facility] Smitty Cords, DO   2 years ago Anxiety   [facility]on, [name], DO       Future Appointments             In 11 months Althea Charon, [name], DO [facility], [facility]               Requested Prescriptions  Pending Prescriptions Disp Refills   ADVAIR DISKUS 100-50 MCG/ACT AEPB [Pharmacy Med Name: ADVAIR 100-50 DISKUS] 180 each 3    Sig: INHALE 1 PUFF BY MOUTH TWICE A DAY     Pulmonology:  Combination Products Passed - 10/10/2021  9:42 AM      Passed - Valid encounter within last 12 months    Recent Outpatient Visits           2 weeks ago Influenza   [facility] [place], [name], DO   1 month ago Annual physical exam   [facility] Smitty Cords, DO   6 months ago COVID-19 virus infection   [facility] [place], [name], DO   1 year ago Annual physical exam   [facility] Smitty Cords, DO   2 years ago  Anxiety   [facility] Althea Charon, [name], DO       Future Appointments             In 11 months Althea Charon, [name], DO [facility], [facility]

## 2021-10-17 ENCOUNTER — Other Ambulatory Visit: Payer: Self-pay | Admitting: Family Medicine

## 2021-10-17 DIAGNOSIS — J111 Influenza due to unidentified influenza virus with other respiratory manifestations: Secondary | ICD-10-CM

## 2021-10-18 NOTE — Telephone Encounter (Signed)
Requested medication (s) are due for refill today:   Yes  Requested medication (s) are on the active medication list:   Yes  Future visit scheduled:   Yes   Last ordered: 09/24/2021 15 ml, 0 refills  Returned because there's not a protocol assigned to this medication.   Requested Prescriptions  Pending Prescriptions Disp Refills   ipratropium (ATROVENT) 0.06 % nasal spray [Pharmacy Med Name: IPRATROPIUM 0.06% SPRAY]      Sig: Place 2 sprays into both nostrils 4 (four) times daily. For up to 5-7 days then stop.     Off-Protocol Failed - 10/17/2021  2:34 PM      Failed - Medication not assigned to a protocol, review manually.      Passed - Valid encounter within last 12 months    Recent Outpatient Visits           3 weeks ago Influenza   [facility] [place], [name], DO   1 month ago Annual physical exam   [facility] Smitty Cords, DO   7 months ago COVID-19 virus infection   [facility] Althea Charon, [name], DO   1 year ago Annual physical exam   [facility] Smitty Cords, DO   2 years ago Anxiety   [facility] Smitty Cords, DO       Future Appointments             In 10 months Althea Charon, [name], DO [facility], [facility]           Off-Protocol Failed - 10/17/2021  2:34 PM      Failed - Medication not assigned to a protocol, review manually.      Passed - Valid encounter within last 12 months    Recent Outpatient Visits           3 weeks ago Influenza   [facility] [place], [name], DO   1 month ago Annual physical exam   [facility] Smitty Cords, DO   7 months ago COVID-19 virus infection   [facility] Althe[facility] Charon, [name], DO   1 year ago Annual physical exam   [facility] Smitty C[facility], DO   2 years ago Anxiety    [facility] Althea Charon, [name], DO       Future Appointments             In 10 months Althea Charon, [name], DO [facility], [facility]

## 2021-10-23 ENCOUNTER — Encounter: Payer: Self-pay | Admitting: Family Medicine

## 2021-10-23 DIAGNOSIS — M62838 Other muscle spasm: Secondary | ICD-10-CM

## 2021-10-28 MED ORDER — BACLOFEN 10 MG PO TABS: 5.0000 mg | ORAL_TABLET | Freq: Two times a day (BID) | ORAL | 1 refills | Status: DC | PRN

## 2021-10-31 ENCOUNTER — Other Ambulatory Visit: Payer: Self-pay | Admitting: Family Medicine

## 2021-10-31 DIAGNOSIS — J111 Influenza due to unidentified influenza virus with other respiratory manifestations: Secondary | ICD-10-CM

## 2021-10-31 NOTE — Telephone Encounter (Signed)
Requested medication (s) are due for refill today: yes  Requested medication (s) are on the active medication list: yes  Last refill:  09/24/21  Future visit scheduled: yes  Notes to clinic:  Unable to refill per protocol, medication not assigned to the refill protocol.      Requested Prescriptions  Pending Prescriptions Disp Refills   ipratropium (ATROVENT) 0.06 % nasal spray [Pharmacy Med Name: IPRATROPIUM 0.06% SPRAY]      Sig: Place 2 sprays into both nostrils 4 (four) times daily. For up to 5-7 days then stop.     Off-Protocol Failed - 10/31/2021  3:00 PM      Failed - Medication not assigned to a protocol, review manually.      Passed - Valid encounter within last 12 months    Recent Outpatient Visits           1 month ago Influenza   [facility] [place], [name], DO   1 month ago Annual physical exam   [facility] Smitty Cords, DO   7 months ago COVID-19 virus infection   [facility] Althea Charon, [name], DO   1 year ago Annual physical exam   [facility] Smitty C[facility], DO   2 years ago Anxiety   [facility] Smitty Cords, DO       Future Appointments             In 10 months Althea Charon, [name], DO [facility], [facility]           Off-Protocol Failed - 10/31/2021  3:00 PM      Failed - Medication not assigned to a protocol, review manually.      Passed - Valid encounter within last 12 months    Recent Outpatient Visits           1 month ago Influenza   [facility] [place], [name], DO   1 month ago Annual physical exam   [facility] Smitty Cords, DO   7 months ago COVID-19 virus infection   [facility] Althea Charon, [name], DO   1 year ago Annual physical exam   [facility] Smitty Cords, DO   2 years ago Anxiety    [facility] Althea Charon, [name], DO       Future Appointments             In 10 months Althea Charon, [name], DO [facility], [facility]

## 2021-11-04 ENCOUNTER — Other Ambulatory Visit: Payer: Self-pay | Admitting: Family Medicine

## 2021-11-04 DIAGNOSIS — M62838 Other muscle spasm: Secondary | ICD-10-CM

## 2021-11-04 NOTE — Telephone Encounter (Signed)
Requested medication (s) are due for refill today: no  Requested medication (s) are on the active medication list: {yes  Last refill: 10/28/21  #30  1 refill  Future visit scheduled yes 09/07/22  Notes to clinic:Not delegated.   Requested Prescriptions  Pending Prescriptions Disp Refills   baclofen (LIORESAL) 10 MG tablet [Pharmacy Med Name: BACLOFEN 10 MG TABLET] 30 tablet 1    Sig: Take 0.5-1 tablets (5-10 mg total) by mouth 2 (two) times daily as needed for muscle spasms.     Not Delegated - Analgesics:  Muscle Relaxants Failed - 11/04/2021  1:30 PM      Failed - This refill cannot be delegated      Passed - Valid encounter within last 6 months    Recent Outpatient Visits           1 month ago Influenza   [facility] [place], [name], DO   2 months ago Annual physical exam   [facility] Smitty Cords, DO   7 months ago COVID-19 virus infection   [facility] Althea Charon, [name], DO   1 year ago Annual physical exam   [facility] Smitty Cords, DO   2 years ago Anxiety   [facility] Althea Charon, [name], DO       Future Appointments             In 10 months Althea Charon, [name], DO [facility], [facility]

## 2021-11-10 ENCOUNTER — Other Ambulatory Visit: Payer: Self-pay | Admitting: Family Medicine

## 2021-11-10 DIAGNOSIS — J111 Influenza due to unidentified influenza virus with other respiratory manifestations: Secondary | ICD-10-CM

## 2021-11-11 NOTE — Telephone Encounter (Signed)
Requested medication (s) are due for refill today: no  Requested medication (s) are on the active medication list: yes  Last refill:  10/31/21  Future visit scheduled: 09/07/22  Notes to clinic:  medication is not assigned to a protocol, please assess.  Requested Prescriptions  Pending Prescriptions Disp Refills   ipratropium (ATROVENT) 0.06 % nasal spray [Pharmacy Med Name: IPRATROPIUM 0.06% SPRAY]      Sig: PLACE 2 SPRAYS INTO BOTH NOSTRILS 4 (FOUR) TIMES DAILY. FOR UP TO 5-7 DAYS THEN STOP.     Off-Protocol Failed - 11/10/2021  2:32 PM      Failed - Medication not assigned to a protocol, review manually.      Passed - Valid encounter within last 12 months    Recent Outpatient Visits           1 month ago Influenza   [facility] [place], [name], DO   2 months ago Annual physical exam   [facility] Smitty C[facility], DO   7 months ago COVID-19 virus infection   [facility] Althea Charon, [name], DO   1 year ago Annual physical exam   [facility] Smit[facility] Cords, DO   2 years ago Anxiety   [facility] Althea Charon, [name], DO       Future Appointments             In 10 months Althea Charon, [name], DO [facility], [facility]           Off-Protocol Failed - 11/10/2021  2:32 PM      Failed - Medication not assigned to a protocol, review manually.      Passed - Valid encounter within last 12 months    Recent Outpatient Visits           1 month ago Influenza   [facility] [place], [name], DO   2 months ago Annual physical exam   [facility] Smitty Cords, DO   7 months ago COVID-19 virus infection   [facility] Char[facility], [name], DO   1 year ago Annual physical exam   [facility] Smitty Cords, DO   2 years ago Anxiety   [facility] Althea Charon, [name], DO       Future Appointments             In 10 months Althea Charon, [name], DO [facility], [facility]

## 2021-11-17 ENCOUNTER — Other Ambulatory Visit: Payer: Self-pay | Admitting: Family Medicine

## 2021-11-17 DIAGNOSIS — M62838 Other muscle spasm: Secondary | ICD-10-CM

## 2021-11-18 NOTE — Telephone Encounter (Signed)
Requested medication (s) are due for refill today: yes  Requested medication (s) are on the active medication list: yes  Last refill:  11/05/21 #30  Future visit scheduled: yes  Notes to clinic:  Please review for refill. Refill not delegated per protocol    Requested Prescriptions  Pending Prescriptions Disp Refills   baclofen (LIORESAL) 10 MG tablet [Pharmacy Med Name: BACLOFEN 10 MG TABLET] 30 tablet 1    Sig: TAKE 0.5-1 TABLETS (5-10 MG TOTAL) BY MOUTH 2 (TWO) TIMES DAILY AS NEEDED FOR MUSCLE SPASMS.     Not Delegated - Analgesics:  Muscle Relaxants Failed - 11/17/2021 10:30 AM      Failed - This refill cannot be delegated      Passed - Valid encounter within last 6 months    Recent Outpatient Visits           1 month ago Influenza   [facility] [place], [name], DO   2 months ago Annual physical exam   [facility] Smitty C[facility], DO   8 months ago COVID-19 virus infection   [facility] Althea Charon, [name], DO   1 year ago Annual physical exam   [facility] Smitty Cords, DO   2 years ago Anxiety   [facility] Althe[facility] Charon, [name], DO       Future Appointments             In 9 months Althea Charon, [name], DO [facility], [facility]

## 2021-11-27 ENCOUNTER — Telehealth: Payer: Commercial Managed Care - PPO | Admitting: Family Medicine

## 2021-11-27 ENCOUNTER — Encounter: Payer: Self-pay | Admitting: Family Medicine

## 2021-11-27 VITALS — Ht 62.0 in | Wt 134.0 lb

## 2021-11-27 DIAGNOSIS — J4520 Mild intermittent asthma, uncomplicated: Secondary | ICD-10-CM

## 2021-11-27 DIAGNOSIS — U071 COVID-19: Secondary | ICD-10-CM

## 2021-11-27 MED ORDER — PREDNISONE 10 MG PO TABS: ORAL_TABLET | ORAL | 0 refills | Status: DC

## 2021-11-27 NOTE — Patient Instructions (Addendum)
Thank you for coming to the office today.  COVID-19  Start Tessalon Perls take 1 capsule up to 3 times a day as needed for cough Start Atrovent nasal spray decongestant 2 sprays in each nostril up to 4 times daily for 7 days IF not improving Take Prednisone taper over 6 days for asthma/breathing / wheezing  Please schedule a Follow-up Appointment to: Return if symptoms worsen or fail to improve.  If you have any other questions or concerns, please feel free to call the office or send a message through [place]. You may also schedule an earlier appointment if necessary.  Additionally, you may be receiving a survey about your experience at our office within a few days to 1 week by e-mail or mail. We value your feedback.  Nobie Putnam, DO [place]

## 2021-11-27 NOTE — Progress Notes (Addendum)
Subjective:    Patient ID: Maureen Allen, female    DOB: Apr 02, 1976, 46 y.o.   MRN: [id number]  Maureen Allen is a 46 y.o. female presenting on 11/27/2021 for Cough and Headache  Virtual / Telehealth Encounter - Video Visit via MyChart The purpose of this virtual visit is to provide medical care while limiting exposure to the novel coronavirus (COVID19) for both patient and office staff.  Consent was obtained for remote visit:  Yes.   Answered questions that patient had about telehealth interaction:  Yes.   I discussed the limitations, risks, security and privacy concerns of performing an evaluation and management service by video/telephone. I also discussed with the patient that there may be a patient responsible charge related to this service. The patient expressed understanding and agreed to proceed.  Patient Location: Home Provider Location: [facility] (Office)  Participants in virtual visit: - Patient: Maureen Allen - CMA: Orinda Kenner, [place] - Provider: Dr Parks Ranger   HPI  COVID-19 Infection She returned from [place] travel on Sunday, she tested negative on Monday as a precaution. One traveler with them was positive for COVID. Her symptoms started on Wednesday with sore throat, later Wednesday 11/26/21 tested POSITIVE. Admits assoc symptoms runny nose congestion, dry cough, occasional vertigo and woozy feeling. Has Atrovent nasal decongestant and would need Tesalon perls PMH - Asthma, currently doing well without dyspnea or wheezing. Continues Advair and has Albuterol xopenex inhaler PRN. Husband not ill currently, testing negative. Feels fine. He may need FMLA to stay home to care for her - to be determined.  Prior history COVID infection 02/2021, she had Flu 09/2021  Depression screen [facility] 2/9 09/05/2021 03/21/2021 08/19/2020  Decreased Interest 0 0 0  Down, Depressed, Hopeless 0 0 0  PHQ - 2 Score 0 0 0  Altered sleeping 0 0 1  Tired, decreased energy 0 0 1  Change in  appetite 0 0 0  Feeling bad or failure about yourself  0 0 0  Trouble concentrating 0 0 0  Moving slowly or fidgety/restless 0 0 1  Suicidal thoughts 0 0 0  PHQ-9 Score 0 0 3  Difficult doing work/chores Not difficult at all - Somewhat difficult    Social History   Tobacco Use   Smoking status: Former    Types: Cigarettes    Quit date: 11/24/1999    Years since quitting: 22.0   Smokeless tobacco: Never  Vaping Use   Vaping Use: Never used  Substance Use Topics   Alcohol use: Yes    Alcohol/week: 1.0 standard drink    Types: 1 Standard drinks or equivalent per week   Drug use: No    Review of Systems Per HPI unless specifically indicated above     Objective:    Ht _0  (1.575 m)    Wt 134 lb (60.8 kg)    BMI 24.51 kg/m   Wt Readings from Last 3 Encounters:  11/27/21 134 lb (60.8 kg)  09/24/21 134 lb (60.8 kg)  09/05/21 134 lb 9.6 oz (61.1 kg)    Physical Exam  Note examination was completely remotely via video observation objective data only  Gen - well-appearing, no acute distress or apparent pain, comfortable HEENT - eyes appear clear without discharge or redness Heart/Lungs - cannot examine virtually - observed no evidence of coughing or labored breathing. Abd - cannot examine virtually  Skin - face visible today- no rash Neuro - awake, alert, oriented Psych - not anxious appearing  Results for orders placed or performed in visit on 09/05/21  COMPLETE METABOLIC PANEL WITH GFR  Result Value Ref Range   Glucose, Bld 91 65 - 139 mg/dL   BUN 10 7 - 25 mg/dL   Creat 0.70 0.50 - 0.99 mg/dL   eGFR 109 > OR = 60 mL/min/1.67m   BUN/Creatinine Ratio NOT APPLICABLE 6 - 22 (calc)   Sodium 138 135 - 146 mmol/L   Potassium 4.2 3.5 - 5.3 mmol/L   Chloride 105 98 - 110 mmol/L   CO2 25 20 - 32 mmol/L   Calcium 9.4 8.6 - 10.2 mg/dL   Total Protein 6.8 6.1 - 8.1 g/dL   Albumin 4.5 3.6 - 5.1 g/dL   Globulin 2.3 1.9 - 3.7 g/dL (calc)   AG Ratio 2.0 1.0 - 2.5 (calc)    Total Bilirubin 0.5 0.2 - 1.2 mg/dL   Alkaline phosphatase (APISO) 60 31 - 125 U/L   AST 19 10 - 35 U/L   ALT 19 6 - 29 U/L  Lipid panel  Result Value Ref Range   Cholesterol 239 (H) <200 mg/dL   HDL 72 > OR = 50 mg/dL   Triglycerides 95 <150 mg/dL   LDL Cholesterol (Calc) 147 (H) mg/dL (calc)   Total CHOL/HDL Ratio 3.3 <5.0 (calc)   Non-HDL Cholesterol (Calc) 167 (H) <130 mg/dL (calc)  CBC with Differential/Platelet  Result Value Ref Range   WBC 5.0 3.8 - 10.8 Thousand/uL   RBC 4.66 3.80 - 5.10 Million/uL   Hemoglobin 13.9 11.7 - 15.5 g/dL   HCT 42.5 35.0 - 45.0 %   MCV 91.2 80.0 - 100.0 fL   MCH 29.8 27.0 - 33.0 pg   MCHC 32.7 32.0 - 36.0 g/dL   RDW 12.4 11.0 - 15.0 %   Platelets 288 140 - 400 Thousand/uL   MPV 9.8 7.5 - 12.5 fL   Neutro Abs 2,795 1,500 - 7,800 cells/uL   Lymphs Abs 1,435 850 - 3,900 cells/uL   Absolute Monocytes 410 200 - 950 cells/uL   Eosinophils Absolute 280 15 - 500 cells/uL   Basophils Absolute 80 0 - 200 cells/uL   Neutrophils Relative % 55.9 %   Total Lymphocyte 28.7 %   Monocytes Relative 8.2 %   Eosinophils Relative 5.6 %   Basophils Relative 1.6 %  TSH  Result Value Ref Range   TSH 1.53 mIU/L      Assessment & Plan:   Problem List Items Addressed This Visit     COVID-19 virus infection - Primary   Relevant Medications   predniSONE (DELTASONE) 10 MG tablet   Asthma   Relevant Medications   predniSONE (DELTASONE) 10 MG tablet    COVID19 positive Symptom 1st onset 11/26/21 Confirm home test positive 11/26/21 Mild to moderate symptoms currently. No red flags or dyspnea Risk factor Asthma Declines anti viral covid therapy due to mild course Will treat symptoms Start Tessalon Perls take 1 capsule up to 3 times a day as needed for cough Start Atrovent nasal spray decongestant 2 sprays in each nostril up to 4 times daily for 7 days Prednisone taper 60 to 119mover 6 day ONLY IF NEEDED PRN asthma Supportive care OTC PRN Follow-up  criteria given.  Update 12/11/21 Received fax from [employer] patient's spouse JuJaleea AlesiSpoke with him. He missed 11/28/21 date while caring for her at home due to her incapacity with covid, quarantined, needed help with meals, transportation, medication, and child care. No further follow  up or intermittent time. Expected to be out 3 days, 11/26/21 to 11/29/21. He missed only 11/28/21. Fax paperwork back today.   Meds ordered this encounter  Medications   predniSONE (DELTASONE) 10 MG tablet    Sig: Take 6 tabs with breakfast Day 1, 5 tabs Day 2, 4 tabs Day 3, 3 tabs Day 4, 2 tabs Day 5, 1 tab Day 6.    Dispense:  21 tablet    Refill:  0      Follow up plan: Return if symptoms worsen or fail to improve.  Patient verbalizes understanding with the above medical recommendations including the limitation of remote medical advice.  Specific follow-up and call-back criteria were given for patient to follow-up or seek medical care more urgently if needed.  Total duration of direct patient care provided via video conference: 10 minutes   Nobie Putnam, [facility] 11/27/2021, 11:34 AM

## 2021-11-28 ENCOUNTER — Other Ambulatory Visit: Payer: Self-pay | Admitting: Family Medicine

## 2021-11-28 DIAGNOSIS — U071 COVID-19: Secondary | ICD-10-CM

## 2021-11-28 MED ORDER — BENZONATATE 100 MG PO CAPS: 100.0000 mg | ORAL_CAPSULE | Freq: Three times a day (TID) | ORAL | 0 refills | Status: DC | PRN

## 2021-12-02 ENCOUNTER — Other Ambulatory Visit: Payer: Self-pay | Admitting: Family Medicine

## 2021-12-02 DIAGNOSIS — M62838 Other muscle spasm: Secondary | ICD-10-CM

## 2021-12-02 NOTE — Telephone Encounter (Signed)
Requested medication (s) are due for refill today - no  Requested medication (s) are on the active medication list -yes  Future visit scheduled -yes  Last refill: 11/18/21 #30 1RF  Notes to clinic: Request RF: non delegated Rx  Requested Prescriptions  Pending Prescriptions Disp Refills   baclofen (LIORESAL) 10 MG tablet [Pharmacy Med Name: BACLOFEN 10 MG TABLET] 30 tablet 1    Sig: TAKE 0.5-1 TABLETS (5-10 MG TOTAL) BY MOUTH 2 (TWO) TIMES DAILY AS NEEDED FOR MUSCLE SPASMS.     Not Delegated - Analgesics:  Muscle Relaxants Failed - 12/02/2021  9:31 AM      Failed - This refill cannot be delegated      Passed - Valid encounter within last 6 months    Recent Outpatient Visits           5 days ago COVID-19 virus infection   [facility], [name], DO   2 months ago Influenza   [facility] [place], [name], DO   2 months ago Annual physical exam   [facility] Smitty Cords, DO   8 months ago COVID-19 virus infection   [facility] Althea Charon, [name], DO   1 year ago Annual physical exam   [facility] Althea Charon, [name], DO       Future Appointments             In 9 months Althea Charon, [name], DO [facility], [facility]               Requested Prescriptions  Pending Prescriptions Disp Refills   baclofen (LIORESAL) 10 MG tablet [Pharmacy Med Name: BACLOFEN 10 MG TABLET] 30 tablet 1    Sig: TAKE 0.5-1 TABLETS (5-10 MG TOTAL) BY MOUTH 2 (TWO) TIMES DAILY AS NEEDED FOR MUSCLE SPASMS.     Not Delegated - Analgesics:  Muscle Relaxants Failed - 12/02/2021  9:31 AM      Failed - This refill cannot be delegated      Passed - Valid encounter within last 6 months    Recent Outpatient Visits           5 days ago COVID-19 virus infection   [facility] [place], [name], DO   2 months ago Influenza   [facility], [name], DO   2 months ago Annual physical exam   [facility] Smitty Cords, DO   8 months ago COVID-19 virus infection   [facility] Althe[facility] Char[facility], [name], DO   1 year ago Annual physical exam   [facility] Smitty Cords, DO       Future Appointments             In 9 months Althea Charon, [name], DO [facility], [facility]

## 2021-12-17 ENCOUNTER — Other Ambulatory Visit: Payer: Self-pay | Admitting: Family Medicine

## 2021-12-17 DIAGNOSIS — M62838 Other muscle spasm: Secondary | ICD-10-CM

## 2021-12-17 LAB — HM PAP SMEAR: HM Pap smear: NEGATIVE

## 2021-12-17 NOTE — Telephone Encounter (Signed)
Requested medication (s) are due for refill today: Unclear, ordered #30 but can take twice a day  Requested medication (s) are on the active medication list: yes  Last refill:  11/18/21 #30 with 1 RF  Future visit scheduled: 09/07/2022  Notes to clinic:  This medication can not be delegated, please assess.   Requested Prescriptions  Pending Prescriptions Disp Refills   baclofen (LIORESAL) 10 MG tablet [Pharmacy Med Name: BACLOFEN 10 MG TABLET] 30 tablet 1    Sig: TAKE 0.5-1 TABLETS (5-10 MG TOTAL) BY MOUTH 2 (TWO) TIMES DAILY AS NEEDED FOR MUSCLE SPASMS.     Not Delegated - Analgesics:  Muscle Relaxants Failed - 12/17/2021  2:34 PM      Failed - This refill cannot be delegated      Passed - Valid encounter within last 6 months    Recent Outpatient Visits           2 weeks ago COVID-19 virus infection   [facility] [place], [name], DO   2 months ago Influenza   [facility] [place], [name], DO   3 months ago Annual physical exam   [facility] Smitty Cords, DO   9 months ago COVID-19 virus infection   [facility] Althea Char[facility], [name], DO   1 year ago Annual physical exam   [facility] Smitty Cords, DO       Future Appointments             In 8 months Althea Charon, [name], DO [facility], [facility]

## 2021-12-29 ENCOUNTER — Other Ambulatory Visit: Payer: Self-pay | Admitting: Family Medicine

## 2021-12-29 DIAGNOSIS — M62838 Other muscle spasm: Secondary | ICD-10-CM

## 2021-12-30 NOTE — Telephone Encounter (Signed)
Requested medications are due for refill today.  Seems too soon  Requested medications are on the active medications list.  yes  Last refill. 12/18/2021 #30 1 refill  Future visit scheduled.   Yes - in 8 months  Notes to clinic.  Medication was just refilled. Medication not delegated.    Requested Prescriptions  Pending Prescriptions Disp Refills   baclofen (LIORESAL) 10 MG tablet [Pharmacy Med Name: BACLOFEN 10 MG TABLET] 30 tablet 1    Sig: TAKE 0.5-1 TABLETS (5-10 MG TOTAL) BY MOUTH 2 (TWO) TIMES DAILY AS NEEDED FOR MUSCLE SPASMS.     Analgesics:  Muscle Relaxants - baclofen Passed - 12/29/2021  2:30 PM      Passed - Cr in normal range and within 180 days    Creat  Date Value Ref Range Status  09/05/2021 0.70 0.50 - 0.99 mg/dL Final          Passed - eGFR is 30 or above and within 180 days    GFR, Est African American  Date Value Ref Range Status  08/19/2020 118 > OR = 60 mL/min/1.78m Final   GFR, Est Non African American  Date Value Ref Range Status  08/19/2020 102 > OR = 60 mL/min/1.764mFinal   eGFR  Date Value Ref Range Status  09/05/2021 109 > OR = 60 mL/min/1.7353minal    Comment:    The eGFR is based on the CKD-EPI 2021 equation. To calculate  the new eGFR from a previous Creatinine or Cystatin C result, go to https://www.kidney.org/professionals/ kdoqi/gfr%5Fcalculator           Passed - Valid encounter within last 6 months    Recent Outpatient Visits           1 month ago COVID-19 virus infection   [place]   3 months ago Influenza   [place]   3 months ago Annual physical exam   [facility]   9 months ago COVID-19 virus infection   [place]   1 year ago Annual physical exam   [place]       Future  Appointments             In 8 months [facility]

## 2022-01-21 ENCOUNTER — Other Ambulatory Visit: Payer: Self-pay | Admitting: Family Medicine

## 2022-01-21 DIAGNOSIS — J4520 Mild intermittent asthma, uncomplicated: Secondary | ICD-10-CM

## 2022-01-21 NOTE — Telephone Encounter (Signed)
Requested Prescriptions  ?Pending Prescriptions Disp Refills  ?? levalbuterol (XOPENEX HFA) 45 MCG/ACT inhaler [Pharmacy Med Name: LEVALBUTEROL TAR HFA INH] 15 each 2  ?  Sig: INHALE 1 TO 2 PUFFS BY MOUTH EVERY 6 HOURS AS NEEDED FOR WHEEZE  ?  ? Pulmonology:  Beta Agonists 2 Passed - 01/21/2022 12:37 PM  ?  ?  Passed - Last BP in normal range  ?  BP Readings from Last 1 Encounters:  ?09/05/21 122/84  ?   ?  ?  Passed - Last Heart Rate in normal range  ?  Pulse Readings from Last 1 Encounters:  ?09/05/21 (!) 101  ?   ?  ?  Passed - Valid encounter within last 12 months  ?  Recent Outpatient Visits   ?      ? 1 month ago COVID-19 virus infection  ? [facility], [name], DO  ? 3 months ago Influenza  ? [facility] Smitty Cords, DO  ? 4 months ago Annual physical exam  ? [facility] Smitty Cords, DO  ? 10 months ago COVID-19 virus infection  ? [facility] Smitty Cords, DO  ? 1 year ago Annual physical exam  ? [facility] Smitty Cords, DO  ?  ?  ?Future Appointments   ?        ? In 7 months Althea Charon, [name], DO [facility]  ?  ? ?  ?  ?  ? ?

## 2022-03-15 ENCOUNTER — Other Ambulatory Visit: Payer: Self-pay | Admitting: Family Medicine

## 2022-03-15 DIAGNOSIS — F419 Anxiety disorder, unspecified: Secondary | ICD-10-CM

## 2022-03-17 NOTE — Telephone Encounter (Signed)
rx was sent to pharmacy on 11/25/21 #180/2 ?Requested Prescriptions  ?Pending Prescriptions Disp Refills  ?? hydrOXYzine (ATARAX) 25 MG tablet [Pharmacy Med Name: HYDROXYZINE HCL 25 MG TABLET] 180 tablet 2  ?  Sig: TAKE 1/2 TO 1 TABLETS (12.5-25 MG TOTAL) BY MOUTH 2 (TWO) TIMES DAILY AS NEEDED FOR ANXIETY.  ?  ? Ear, Nose, and Throat:  Antihistamines 2 Passed - 03/15/2022  9:00 PM  ?  ?  Passed - Cr in normal range and within 360 days  ?  Creat  ?Date Value Ref Range Status  ?09/05/2021 0.70 0.50 - 0.99 mg/dL Final  ?   ?  ?  Passed - Valid encounter within last 12 months  ?  Recent Outpatient Visits   ?      ? 3 months ago COVID-19 virus infection  ? [facility] [place], [name], DO  ? 5 months ago Influenza  ? [facility] Smitty Cords, DO  ? 6 months ago Annual physical exam  ? [facility] Smitty Cords, DO  ? 12 months ago COVID-19 virus infection  ? [facility] Smitty Cords, DO  ? 1 year ago Annual physical exam  ? [facility] Smitty Cords, DO  ?  ?  ?Future Appointments   ?        ? In 5 months Althea Charon, [name], DO [facility]  ?  ? ?  ?  ?  ? ? ?

## 2022-03-19 ENCOUNTER — Other Ambulatory Visit: Payer: Self-pay | Admitting: Family Medicine

## 2022-03-19 DIAGNOSIS — F419 Anxiety disorder, unspecified: Secondary | ICD-10-CM

## 2022-03-23 NOTE — Telephone Encounter (Signed)
Requested medication (s) are due for refill today: no ? ?Requested medication (s) are on the active medication list: yes ? ?Last refill:  11/25/21 ? ?Future visit scheduled: yes ? ?Notes to clinic:  Unable to refill per protocol, Rx was refused 03/17/22, request too soon. Last refill 11/25/21 for 180, 2 refills. ? ? ?  ?Requested Prescriptions  ?Pending Prescriptions Disp Refills  ? hydrOXYzine (ATARAX) 25 MG tablet [Pharmacy Med Name: HYDROXYZINE HCL 25 MG TABLET] 180 tablet 2  ?  Sig: TAKE 1/2 TO 1 TABLETS (12.5-25 MG TOTAL) BY MOUTH 2 (TWO) TIMES DAILY AS NEEDED FOR ANXIETY.  ?  ? Ear, Nose, and Throat:  Antihistamines 2 Passed - 03/19/2022  6:41 PM  ?  ?  Passed - Cr in normal range and within 360 days  ?  Creat  ?Date Value Ref Range Status  ?09/05/2021 0.70 0.50 - 0.99 mg/dL Final  ?  ?  ?  ?  Passed - Valid encounter within last 12 months  ?  Recent Outpatient Visits   ? ?      ? 3 months ago COVID-19 virus infection  ? [facility], [name], DO  ? 6 months ago Influenza  ? [facility] Smitty Cords, DO  ? 6 months ago Annual physical exam  ? [facility] Smitty Cords, DO  ? 1 year ago COVID-19 virus infection  ? [facility] Smitty Cords, DO  ? 1 year ago Annual physical exam  ? [facility] Smitty Cords, DO  ? ?  ?  ?Future Appointments   ? ?        ? In 5 months Althea Charon, [name], DO [facility], [facility]  ? ?  ? ? ?  ?  ?  ? ? ?

## 2022-04-06 ENCOUNTER — Other Ambulatory Visit: Payer: Self-pay | Admitting: Family Medicine

## 2022-04-06 DIAGNOSIS — J4520 Mild intermittent asthma, uncomplicated: Secondary | ICD-10-CM

## 2022-04-07 NOTE — Telephone Encounter (Signed)
Requested Prescriptions  ?Pending Prescriptions Disp Refills  ?? levalbuterol (XOPENEX HFA) 45 MCG/ACT inhaler [Pharmacy Med Name: LEVALBUTEROL TAR HFA 45MCG INH] 15 each 2  ?  Sig: INHALE 1 TO 2 PUFFS BY MOUTH EVERY 6 HOURS AS NEEDED FOR WHEEZE  ?  ? Pulmonology:  Beta Agonists 2 Passed - 04/06/2022 12:13 PM  ?  ?  Passed - Last BP in normal range  ?  BP Readings from Last 1 Encounters:  ?09/05/21 122/84  ?   ?  ?  Passed - Last Heart Rate in normal range  ?  Pulse Readings from Last 1 Encounters:  ?09/05/21 (!) 101  ?   ?  ?  Passed - Valid encounter within last 12 months  ?  Recent Outpatient Visits   ?      ? 4 months ago COVID-19 virus infection  ? [place], DO  ? 6 months ago Influenza  ? [place], DO  ? 7 months ago Annual physical exam  ? [place], DO  ? 1 year ago COVID-19 virus infection  ? [place], DO  ? 1 year ago Annual physical exam  ? [place], DO  ?  ?  ?Future Appointments   ?        ? In 5 months [place], Devonne Doughty, DO [facility] [place]  ?  ? ?  ?  ?  ? ?

## 2022-04-22 ENCOUNTER — Ambulatory Visit: Payer: Self-pay

## 2022-04-22 NOTE — Telephone Encounter (Signed)
  Chief Complaint: High BP  Symptoms: BP of 157/107 and 2 others Frequency: unknown Pertinent Negatives: Patient denies HA, Chest pain Disposition: [] ED /[] Urgent Care (no appt availability in office) / [x] Appointment(In office/virtual)/ []  [facility]/ [] Home Care/ [] Refused Recommended Disposition /[] [facility]/ []  Follow-up with PCP Additional Notes: Pt was seen at dentist today for root canal pre-procedure. At appointment pt had 3 blood pressure readings taken, the highest was 157/107. Pt recently started lexapro, and is concerned that this medication may be causing HTN.  Reason for Disposition  Systolic BP  >= 160 OR Diastolic >= 100  Answer Assessment - Initial Assessment Questions 1. BLOOD PRESSURE: "What is the blood pressure?" "Did you take at least two measurements 5 minutes apart?"     This morning -  2. ONSET: "When did you take your blood pressure?"     This morning 3. HOW: "How did you obtain the blood pressure?" (e.g., visiting nurse, automatic home BP monitor)     At dentist office  - 2 with wrist monitors and 1 reading was done manually. 4. HISTORY: "Do you have a history of high blood pressure?"     no 5. MEDICATIONS: "Are you taking any medications for blood pressure?" "Have you missed any doses recently?"     no 6. OTHER SYMPTOMS: "Do you have any symptoms?" (e.g., headache, chest pain, blurred vision, difficulty breathing, weakness)     none 7. PREGNANCY: "Is there any chance you are pregnant?" "When was your last menstrual period?"     no  Protocols used: Blood Pressure - High-A-AH

## 2022-04-23 ENCOUNTER — Encounter: Payer: Self-pay | Admitting: Family Medicine

## 2022-04-23 ENCOUNTER — Ambulatory Visit (INDEPENDENT_AMBULATORY_CARE_PROVIDER_SITE_OTHER): Payer: Commercial Managed Care - PPO | Admitting: Family Medicine

## 2022-04-23 VITALS — BP 136/82 | HR 84 | Ht 62.0 in | Wt 135.8 lb

## 2022-04-23 DIAGNOSIS — R030 Elevated blood-pressure reading, without diagnosis of hypertension: Secondary | ICD-10-CM | POA: Diagnosis not present

## 2022-04-23 NOTE — Patient Instructions (Addendum)
Thank you for coming to the office today.  Overall I am okay with your BP now. I think the elevated readings in the dental office were likely triggered by a variety of factors. I would not recommend starting blood pressure medication and at this time, I would not diagnose Hypertension  You are medically cleared to proceed with the dental work / surgery.  Please schedule a Follow-up Appointment to: Return if symptoms worsen or fail to improve.  If you have any other questions or concerns, please feel free to call the office or send a message through [place]. You may also schedule an earlier appointment if necessary.  Additionally, you may be receiving a survey about your experience at our office within a few days to 1 week by e-mail or mail. We value your feedback.  Nobie Putnam, DO [place]

## 2022-04-23 NOTE — Progress Notes (Signed)
Subjective:    Patient ID: Maureen Allen, female    DOB: [birth year], 46 y.o.   MRN: [id number]  Maureen Allen is a 46 y.o. female presenting on 04/23/2022 for Hypertension   HPI  Elevated BP without HTN  Upcoming oral surgery apt for dental work, root canal. She was seen yesterday 04/22/22, she had some X-rays at that time. They took her BP initially when she presented at their office yesterday. For the upcoming procedure, she would receive a Xanax PRN prior to procedure and also local anesthesia.   BP readings initially at the dentist office were documented: 158/107, then 159/113, then 160/103  Home Wrist Cuff after ate and rested. 5/31 - reports 138/93, HR 89, 127/65 HR 80, 133/78 HR 92, PM reading 130/80, HR 106  6/1 - 142/77 HR 72, 127/69 HR 88  Current 04/23/22 on her wrist cuff in office 141/93, HR 84  She is asymptomatic without headache dizziness lightheadedness chest pain dyspnea edema     04/23/2022   11:22 AM 09/05/2021    8:45 AM 03/21/2021    9:46 AM  Depression screen PHQ 2/9  Decreased Interest 0 0 0  Down, Depressed, Hopeless 0 0 0  PHQ - 2 Score 0 0 0  Altered sleeping 2 0 0  Tired, decreased energy 1 0 0  Change in appetite 0 0 0  Feeling bad or failure about yourself  1 0 0  Trouble concentrating 1 0 0  Moving slowly or fidgety/restless 0 0 0  Suicidal thoughts 0 0 0  PHQ-9 Score 5 0 0  Difficult doing work/chores Somewhat difficult Not difficult at all     Social History   Tobacco Use   Smoking status: Former    Types: Cigarettes    Quit date: 11/24/1999    Years since quitting: 22.4   Smokeless tobacco: Never  Vaping Use   Vaping Use: Never used  Substance Use Topics   Alcohol use: Yes    Alcohol/week: 1.0 standard drink    Types: 1 Standard drinks or equivalent per week   Drug use: No    Review of Systems Per HPI unless specifically indicated above     Objective:    BP 136/82   Pulse 84   Ht 5' 2"  (1.575 m)   Wt 135 lb 12.8 oz (61.6 kg)    SpO2 99%   BMI 24.84 kg/m   Wt Readings from Last 3 Encounters:  04/23/22 135 lb 12.8 oz (61.6 kg)  11/27/21 134 lb (60.8 kg)  09/24/21 134 lb (60.8 kg)    Physical Exam Vitals and nursing note reviewed.  Constitutional:      General: She is not in acute distress.    Appearance: She is well-developed. She is not diaphoretic.     Comments: Well-appearing, comfortable, cooperative  HENT:     Head: Normocephalic and atraumatic.  Eyes:     General:        Right eye: No discharge.        Left eye: No discharge.     Conjunctiva/sclera: Conjunctivae normal.  Neck:     Thyroid: No thyromegaly.  Cardiovascular:     Rate and Rhythm: Normal rate and regular rhythm.     Heart sounds: Normal heart sounds. No murmur heard. Pulmonary:     Effort: Pulmonary effort is normal. No respiratory distress.     Breath sounds: Normal breath sounds. No wheezing or rales.  Musculoskeletal:        General:  Normal range of motion.     Cervical back: Normal range of motion and neck supple.  Lymphadenopathy:     Cervical: No cervical adenopathy.  Skin:    General: Skin is warm and dry.     Findings: No erythema or rash.  Neurological:     Mental Status: She is alert and oriented to person, place, and time.  Psychiatric:        Behavior: Behavior normal.     Comments: Well groomed, good eye contact, normal speech and thoughts      Results for orders placed or performed in visit on 09/05/21  COMPLETE METABOLIC PANEL WITH GFR  Result Value Ref Range   Glucose, Bld 91 65 - 139 mg/dL   BUN 10 7 - 25 mg/dL   Creat 0.70 0.50 - 0.99 mg/dL   eGFR 109 > OR = 60 mL/min/1.105m   BUN/Creatinine Ratio NOT APPLICABLE 6 - 22 (calc)   Sodium 138 135 - 146 mmol/L   Potassium 4.2 3.5 - 5.3 mmol/L   Chloride 105 98 - 110 mmol/L   CO2 25 20 - 32 mmol/L   Calcium 9.4 8.6 - 10.2 mg/dL   Total Protein 6.8 6.1 - 8.1 g/dL   Albumin 4.5 3.6 - 5.1 g/dL   Globulin 2.3 1.9 - 3.7 g/dL (calc)   AG Ratio 2.0 1.0 - 2.5  (calc)   Total Bilirubin 0.5 0.2 - 1.2 mg/dL   Alkaline phosphatase (APISO) 60 31 - 125 U/L   AST 19 10 - 35 U/L   ALT 19 6 - 29 U/L  Lipid panel  Result Value Ref Range   Cholesterol 239 (H) <200 mg/dL   HDL 72 > OR = 50 mg/dL   Triglycerides 95 <150 mg/dL   LDL Cholesterol (Calc) 147 (H) mg/dL (calc)   Total CHOL/HDL Ratio 3.3 <5.0 (calc)   Non-HDL Cholesterol (Calc) 167 (H) <130 mg/dL (calc)  CBC with Differential/Platelet  Result Value Ref Range   WBC 5.0 3.8 - 10.8 Thousand/uL   RBC 4.66 3.80 - 5.10 Million/uL   Hemoglobin 13.9 11.7 - 15.5 g/dL   HCT 42.5 35.0 - 45.0 %   MCV 91.2 80.0 - 100.0 fL   MCH 29.8 27.0 - 33.0 pg   MCHC 32.7 32.0 - 36.0 g/dL   RDW 12.4 11.0 - 15.0 %   Platelets 288 140 - 400 Thousand/uL   MPV 9.8 7.5 - 12.5 fL   Neutro Abs 2,795 1,500 - 7,800 cells/uL   Lymphs Abs 1,435 850 - 3,900 cells/uL   Absolute Monocytes 410 200 - 950 cells/uL   Eosinophils Absolute 280 15 - 500 cells/uL   Basophils Absolute 80 0 - 200 cells/uL   Neutrophils Relative % 55.9 %   Total Lymphocyte 28.7 %   Monocytes Relative 8.2 %   Eosinophils Relative 5.6 %   Basophils Relative 1.6 %  TSH  Result Value Ref Range   TSH 1.53 mIU/L      Assessment & Plan:   Problem List Items Addressed This Visit   None Visit Diagnoses     Elevated BP without diagnosis of hypertension    -  Primary       Elevated BP acutely at dentist / oral surgeon Multiple stressors contributing No documented history of HTN based on years of past data  Home BP wrist cuff readings normalized yesterday and today in office manual BP normal  Advice to patient today  Overall I am okay with your BP now.  I think the elevated readings in the dental office were likely triggered by a variety of factors. I would not recommend starting blood pressure medication and at this time, I would not diagnose Hypertension  You are medically cleared to proceed with the dental work / surgery.   No orders of  the defined types were placed in this encounter.   Follow up plan: Return if symptoms worsen or fail to improve.   Nobie Putnam, [facility] 04/23/2022, 11:34 AM

## 2022-05-15 ENCOUNTER — Ambulatory Visit: Payer: Commercial Managed Care - PPO | Admitting: Family Medicine

## 2022-05-15 ENCOUNTER — Encounter: Payer: Self-pay | Admitting: Family Medicine

## 2022-05-15 VITALS — BP 138/94 | HR 94 | Ht 62.0 in | Wt 137.0 lb

## 2022-05-15 DIAGNOSIS — H6981 Other specified disorders of Eustachian tube, right ear: Secondary | ICD-10-CM | POA: Diagnosis not present

## 2022-05-15 DIAGNOSIS — H9201 Otalgia, right ear: Secondary | ICD-10-CM

## 2022-05-15 DIAGNOSIS — H938X1 Other specified disorders of right ear: Secondary | ICD-10-CM | POA: Diagnosis not present

## 2022-05-15 MED ORDER — PREDNISONE 10 MG PO TABS: ORAL_TABLET | ORAL | 0 refills | Status: DC

## 2022-05-15 MED ORDER — FLUTICASONE PROPIONATE 50 MCG/ACT NA SUSP: 2.0000 | Freq: Every day | NASAL | 3 refills | Status: DC

## 2022-06-18 ENCOUNTER — Other Ambulatory Visit: Payer: Self-pay | Admitting: Family Medicine

## 2022-06-18 DIAGNOSIS — J4520 Mild intermittent asthma, uncomplicated: Secondary | ICD-10-CM

## 2022-06-18 NOTE — Telephone Encounter (Signed)
Requested Prescriptions  Pending Prescriptions Disp Refills  . levalbuterol (XOPENEX HFA) 45 MCG/ACT inhaler [Pharmacy Med Name: LEVALBUTEROL TAR HFA INH] 15 each 2    Sig: INHALE 1 TO 2 PUFFS BY MOUTH EVERY 6 HOURS AS NEEDED FOR WHEEZE     Pulmonology:  Beta Agonists 2 Failed - 06/18/2022  7:46 AM      Failed - Last BP in normal range    BP Readings from Last 1 Encounters:  05/15/22 (!) 138/94         Passed - Last Heart Rate in normal range    Pulse Readings from Last 1 Encounters:  05/15/22 94         Passed - Valid encounter within last 12 months    Recent Outpatient Visits          1 month ago Eustachian tube dysfunction, right   [facility] [place], [name], DO   1 month ago Elevated BP without diagnosis of hypertension   [facility] Smitty Cords, DO   6 months ago COVID-19 virus infection   [facility] [place], [name], DO   8 months ago Influenza   [facility] [place], [name], DO   9 months ago Annual physical exam   [facility] Althea Charon, [name], DO      Future Appointments            In 2 months Althea Charon, [name], DO [facility], [facility]

## 2022-08-10 ENCOUNTER — Other Ambulatory Visit: Payer: Self-pay | Admitting: Family Medicine

## 2022-08-10 DIAGNOSIS — H9201 Otalgia, right ear: Secondary | ICD-10-CM

## 2022-08-10 DIAGNOSIS — H6981 Other specified disorders of Eustachian tube, right ear: Secondary | ICD-10-CM

## 2022-08-10 DIAGNOSIS — H938X1 Other specified disorders of right ear: Secondary | ICD-10-CM

## 2022-08-10 NOTE — Telephone Encounter (Signed)
Requested Prescriptions  Pending Prescriptions Disp Refills  . fluticasone (FLONASE) 50 MCG/ACT nasal spray [Pharmacy Med Name: FLUTICASONE PROP 50 MCG SPRAY] 48 mL 0    Sig: PLACE 2 SPRAYS INTO BOTH NOSTRILS DAILY. USE FOR 4-6 WEEKS THEN STOP AND USE SEASONALLY OR AS NEEDED     Ear, Nose, and Throat: Nasal Preparations - Corticosteroids Passed - 08/10/2022  2:13 AM      Passed - Valid encounter within last 12 months    Recent Outpatient Visits          2 months ago Eustachian tube dysfunction, right   [place], DO   3 months ago Elevated BP without diagnosis of hypertension   [place], DO   8 months ago COVID-19 virus infection   [facility] Olin Hauser, DO   10 months ago Influenza   [facility] Olin Hauser, DO   11 months ago Annual physical exam   [place], DO      Future Appointments            In 4 weeks [place], Devonne Doughty, DO [facility], [facility]

## 2022-08-17 ENCOUNTER — Other Ambulatory Visit: Payer: Self-pay | Admitting: Family Medicine

## 2022-08-17 DIAGNOSIS — J4520 Mild intermittent asthma, uncomplicated: Secondary | ICD-10-CM

## 2022-08-18 NOTE — Telephone Encounter (Signed)
Requested Prescriptions  Pending Prescriptions Disp Refills  . levalbuterol (XOPENEX HFA) 45 MCG/ACT inhaler [Pharmacy Med Name: LEVALBUTEROL TAR HFA 45MCG INH] 15 each 2    Sig: INHALE 1 TO 2 PUFFS BY MOUTH EVERY 6 HOURS AS NEEDED FOR WHEEZE     Pulmonology:  Beta Agonists 2 Failed - 08/17/2022 10:15 AM      Failed - Last BP in normal range    BP Readings from Last 1 Encounters:  05/15/22 (!) 138/94         Passed - Last Heart Rate in normal range    Pulse Readings from Last 1 Encounters:  05/15/22 94         Passed - Valid encounter within last 12 months    Recent Outpatient Visits          3 months ago Eustachian tube dysfunction, right   [facility] [place], [place]   3 months ago Elevated BP without diagnosis of hypertension   [place], DO   8 months ago COVID-19 virus infection   [facility] O[facility], DO   10 months ago Influenza   [facility] Olin Hauser, DO   11 months ago Annual physical exam   [facility] Parks Ranger, Devonne Doughty, DO      Future Appointments            In 2 weeks [place], Devonne Doughty, DO [facility], [facility]

## 2022-09-07 ENCOUNTER — Other Ambulatory Visit: Payer: Self-pay | Admitting: Family Medicine

## 2022-09-07 ENCOUNTER — Encounter: Payer: Self-pay | Admitting: Family Medicine

## 2022-09-07 ENCOUNTER — Ambulatory Visit (INDEPENDENT_AMBULATORY_CARE_PROVIDER_SITE_OTHER): Payer: Commercial Managed Care - PPO | Admitting: Family Medicine

## 2022-09-07 VITALS — BP 110/68 | HR 68 | Ht 62.0 in | Wt 134.8 lb

## 2022-09-07 DIAGNOSIS — Z0000 Encounter for general adult medical examination without abnormal findings: Secondary | ICD-10-CM | POA: Diagnosis not present

## 2022-09-07 DIAGNOSIS — Z131 Encounter for screening for diabetes mellitus: Secondary | ICD-10-CM

## 2022-09-07 DIAGNOSIS — E663 Overweight: Secondary | ICD-10-CM

## 2022-09-07 DIAGNOSIS — J111 Influenza due to unidentified influenza virus with other respiratory manifestations: Secondary | ICD-10-CM

## 2022-09-07 DIAGNOSIS — Z1231 Encounter for screening mammogram for malignant neoplasm of breast: Secondary | ICD-10-CM

## 2022-09-07 DIAGNOSIS — E7800 Pure hypercholesterolemia, unspecified: Secondary | ICD-10-CM | POA: Diagnosis not present

## 2022-09-07 NOTE — Progress Notes (Signed)
Subjective:    Patient ID: Maureen Allen, female    DOB: Jan 16, 1976, 46 y.o.   MRN: [id number]  Maureen Allen is a 46 y.o. female presenting on 09/07/2022 for Annual Exam   HPI  Here for Annual physical and Lab orders.   Lifestyle, BMI 24 Hyperlipidemia  She is doing well overall She is due for labs, fasting today  Exercising increasing Meals balanced more now  Recent history Eustachian tube dysfunction Also lower dental issue, needs root canal Admits both problems were contributing to her recent symptoms Now improved   Anxiety / Major Depression single episode in complete remission Background history of mother passing away in 12/2017, she has had significant symptoms mood and anxiety, with depression and grief reaction. Treated on SSRI has done well. previously Has good support system with her family. Interval update - she remains off Paxil for >2 years now Continues to do well without problem  Insomnia continues Hydroxyzine, especially help sleep at night     Health Maintenance:    UTD Flu Shot and COVID Booster this year already 08/24/22   Colon CA Screening: Last Colonoscopy (done by [facility] Dr Vicente Males), diagnostic due to change in stool caliber and back pain, results with normal colon without polyp or abnormality, good for 10 years. Currently asymptomatic. No known family history of colon CA.  - Next due colonoscopy age 81 - 48 years (approx 2030)   Due for pap smear, she will return to OBGYN in [place] at [facility] clinic, will send [place] copy of pap smear   Order mammogram screening locally at [facility]      09/07/2022    9:36 AM 05/15/2022    3:38 PM 04/23/2022   11:22 AM  Depression screen PHQ 2/9  Decreased Interest 0 0 0  Down, Depressed, Hopeless 0 0 0  PHQ - 2 Score 0 0 0  Altered sleeping _0 Tired, decreased energy 0 1 1  Change in appetite 0 0 0  Feeling bad or failure about yourself  0 1 1  Trouble concentrating 0 1 1  Moving slowly or  fidgety/restless 0 0 0  Suicidal thoughts 0 0 0  PHQ-9 Score _1 Difficult doing work/chores Not difficult at all Somewhat difficult Somewhat difficult    Past Medical History:  Diagnosis Date   Allergy    oak leaves fall, blooming   Anxiety    occasionally has panic attack due to large extra stressor   Asthma    Pre-eclampsia    Past Surgical History:  Procedure Laterality Date   COLONOSCOPY WITH PROPOFOL N/A 12/23/2018   Procedure: COLONOSCOPY WITH PROPOFOL;  Surgeon: Jonathon Bellows, MD;  Location: [facility] END[facility]COPY;  Service: Gastroenterology;  Laterality: N/A;   MOUTH SURGERY  1993   Social History   Socioeconomic History   Marital status: Married    Spouse name: Sareen Randon   Number of children: Not on file   Years of education: Not on file   Highest education level: Not on file  Occupational History   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  Tobacco Use   Smoking status: Former    Types: Cigarettes    Quit date: 11/24/1999    Years since quitting: 22.8   Smokeless tobacco: Never  Vaping Use   Vaping Use: Never used  Substance and Sexual Activity   Alcohol use: Yes    Alcohol/week: 1.0 standard drink of alcohol    Types: 1 Standard drinks or equivalent  per week   Drug use: No   Sexual activity: Yes    Birth control/protection: Surgical    Comment: partner vasectomy  Other Topics Concern   Not on file  Social History Narrative   Not on file   Social Determinants of Health   Financial Resource Strain: Not on file  Food Insecurity: Not on file  Transportation Needs: Not on file  Physical Activity: Not on file  Stress: Not on file  Social Connections: Not on file  Intimate Partner Violence: Not on file   Family History  Adopted: Yes  Problem Relation Age of Onset   Healthy Son    Current Outpatient Medications on File Prior to Visit  Medication Sig   fluticasone (FLONASE) 50 MCG/ACT nasal spray PLACE 2 SPRAYS INTO BOTH NOSTRILS DAILY. USE FOR 4-6 WEEKS THEN  STOP AND USE SEASONALLY OR AS NEEDED   fluticasone-salmeterol (ADVAIR DISKUS) 100-50 MCG/ACT AEPB INHALE 1 PUFF BY MOUTH TWICE A DAY   hydrOXYzine (ATARAX) 25 MG tablet TAKE 1/2 TO 1 TABLETS (12.5-25 MG TOTAL) BY MOUTH 2 (TWO) TIMES DAILY AS NEEDED FOR ANXIETY.   Ibuprofen (ADVIL PO) Take by mouth as needed.   levalbuterol (XOPENEX HFA) 45 MCG/ACT inhaler INHALE 1 TO 2 PUFFS BY MOUTH EVERY 6 HOURS AS NEEDED FOR WHEEZE   loratadine (CLARITIN) 10 MG tablet Take 10 mg by mouth daily.   No current facility-administered medications on file prior to visit.    Review of Systems  Constitutional:  Negative for activity change, appetite change, chills, diaphoresis, fatigue and fever.  HENT:  Negative for congestion and hearing loss.   Eyes:  Negative for visual disturbance.  Respiratory:  Negative for cough, chest tightness, shortness of breath and wheezing.   Cardiovascular:  Negative for chest pain, palpitations and leg swelling.  Gastrointestinal:  Negative for abdominal pain, constipation, diarrhea, nausea and vomiting.  Genitourinary:  Negative for dysuria, frequency and hematuria.  Musculoskeletal:  Negative for arthralgias and neck pain.  Skin:  Negative for rash.  Neurological:  Negative for dizziness, weakness, light-headedness, numbness and headaches.  Hematological:  Negative for adenopathy.  Psychiatric/Behavioral:  Negative for behavioral problems, dysphoric mood and sleep disturbance.    Per HPI unless specifically indicated above     Objective:    BP 110/68   Pulse 68   Ht _0  (1.575 m)   Wt 134 lb 12.8 oz (61.1 kg)   SpO2 96%   BMI 24.66 kg/m   Wt Readings from Last 3 Encounters:  09/07/22 134 lb 12.8 oz (61.1 kg)  05/15/22 137 lb (62.1 kg)  04/23/22 135 lb 12.8 oz (61.6 kg)    Physical Exam Vitals and nursing note reviewed.  Constitutional:      General: She is not in acute distress.    Appearance: She is well-developed. She is not diaphoretic.     Comments:  Well-appearing, comfortable, cooperative  HENT:     Head: Normocephalic and atraumatic.  Eyes:     General:        Right eye: No discharge.        Left eye: No discharge.     Conjunctiva/sclera: Conjunctivae normal.     Pupils: Pupils are equal, round, and reactive to light.  Neck:     Thyroid: No thyromegaly.  Cardiovascular:     Rate and Rhythm: Normal rate and regular rhythm.     Pulses: Normal pulses.     Heart sounds: Normal heart sounds. No murmur heard. Pulmonary:  Effort: Pulmonary effort is normal. No respiratory distress.     Breath sounds: Normal breath sounds. No wheezing or rales.  Abdominal:     General: Bowel sounds are normal. There is no distension.     Palpations: Abdomen is soft. There is no mass.     Tenderness: There is no abdominal tenderness.  Musculoskeletal:        General: No tenderness. Normal range of motion.     Cervical back: Normal range of motion and neck supple.     Comments: Upper / Lower Extremities: - Normal muscle tone, strength bilateral upper extremities 5/5, lower extremities 5/5  Lymphadenopathy:     Cervical: No cervical adenopathy.  Skin:    General: Skin is warm and dry.     Findings: No erythema or rash.  Neurological:     Mental Status: She is alert and oriented to person, place, and time.     Comments: Distal sensation intact to light touch all extremities  Psychiatric:        Mood and Affect: Mood normal.        Behavior: Behavior normal.        Thought Content: Thought content normal.     Comments: Well groomed, good eye contact, normal speech and thoughts        CLINICAL DATA:  Screening.   EXAM: DIGITAL SCREENING BILATERAL MAMMOGRAM WITH TOMOSYNTHESIS AND CAD   TECHNIQUE: Bilateral screening digital craniocaudal and mediolateral oblique mammograms were obtained. Bilateral screening digital breast tomosynthesis was performed. The images were evaluated with computer-aided detection.   COMPARISON:  Previous  exam(s).   ACR Breast Density Category c: The breast tissue is heterogeneously dense, which may obscure small masses.   FINDINGS: There are no findings suspicious for malignancy.   IMPRESSION: No mammographic evidence of malignancy. A result letter of this screening mammogram will be mailed directly to the patient.   RECOMMENDATION: Screening mammogram in one year. (Code:SM-B-01Y)   BI-RADS CATEGORY  1: Negative.     Electronically Signed   By: Kristopher Oppenheim M.D.   On: 10/10/2021 13:37   Results for orders placed or performed in visit on 09/05/21  COMPLETE METABOLIC PANEL WITH GFR  Result Value Ref Range   Glucose, Bld 91 65 - 139 mg/dL   BUN 10 7 - 25 mg/dL   Creat 0.70 0.50 - 0.99 mg/dL   eGFR 109 > OR = 60 mL/min/1.6m2   BUN/Creatinine Ratio NOT APPLICABLE 6 - 22 (calc)   Sodium 138 135 - 146 mmol/L   Potassium 4.2 3.5 - 5.3 mmol/L   Chloride 105 98 - 110 mmol/L   CO2 25 20 - 32 mmol/L   Calcium 9.4 8.6 - 10.2 mg/dL   Total Protein 6.8 6.1 - 8.1 g/dL   Albumin 4.5 3.6 - 5.1 g/dL   Globulin 2.3 1.9 - 3.7 g/dL (calc)   AG Ratio 2.0 1.0 - 2.5 (calc)   Total Bilirubin 0.5 0.2 - 1.2 mg/dL   Alkaline phosphatase (APISO) 60 31 - 125 U/L   AST 19 10 - 35 U/L   ALT 19 6 - 29 U/L  Lipid panel  Result Value Ref Range   Cholesterol 239 (H) <200 mg/dL   HDL 72 > OR = 50 mg/dL   Triglycerides 95 <150 mg/dL   LDL Cholesterol (Calc) 147 (H) mg/dL (calc)   Total CHOL/HDL Ratio 3.3 <5.0 (calc)   Non-HDL Cholesterol (Calc) 167 (H) <130 mg/dL (calc)  CBC with Differential/Platelet  Result Value  Ref Range   WBC 5.0 3.8 - 10.8 Thousand/uL   RBC 4.66 3.80 - 5.10 Million/uL   Hemoglobin 13.9 11.7 - 15.5 g/dL   HCT 42.5 35.0 - 45.0 %   MCV 91.2 80.0 - 100.0 fL   MCH 29.8 27.0 - 33.0 pg   MCHC 32.7 32.0 - 36.0 g/dL   RDW 12.4 11.0 - 15.0 %   Platelets 288 140 - 400 Thousand/uL   MPV 9.8 7.5 - 12.5 fL   Neutro Abs 2,795 1,500 - 7,800 cells/uL   Lymphs Abs 1,435 850 - 3,900  cells/uL   Absolute Monocytes 410 200 - 950 cells/uL   Eosinophils Absolute 280 15 - 500 cells/uL   Basophils Absolute 80 0 - 200 cells/uL   Neutrophils Relative % 55.9 %   Total Lymphocyte 28.7 %   Monocytes Relative 8.2 %   Eosinophils Relative 5.6 %   Basophils Relative 1.6 %  TSH  Result Value Ref Range   TSH 1.53 mIU/L      Assessment & Plan:   Problem List Items Addressed This Visit     Overweight (BMI 25.0-29.9)   Relevant Orders   COMPLETE METABOLIC PANEL WITH GFR   CBC with Differential/Platelet   Lipid panel   Pure hypercholesterolemia   Relevant Orders   COMPLETE METABOLIC PANEL WITH GFR   CBC with Differential/Platelet   Lipid panel   TSH   Other Visit Diagnoses     Annual physical exam    -  Primary   Relevant Orders   COMPLETE METABOLIC PANEL WITH GFR   CBC with Differential/Platelet   Lipid panel   Hemoglobin A1c   TSH   Screening for diabetes mellitus (DM)       Relevant Orders   Hemoglobin A1c   Encounter for screening mammogram for malignant neoplasm of breast       Relevant Orders   MM 3D SCREEN BREAST BILATERAL       Updated Health Maintenance information Fasting labs ordered today Encouraged improvement to lifestyle with diet and exercise Goal of weight loss  We will request copy of last pap smear.  For Mammogram screening for breast cancer   Orders Placed This Encounter  Procedures   MM 3D SCREEN BREAST BILATERAL    Standing Status:   Future    Standing Expiration Date:   09/08/2023    Order Specific Question:   Reason for Exam (SYMPTOM  OR DIAGNOSIS REQUIRED)    Answer:   Screening bilateral 3D Mammogram Tom[place]    Order Specific Question:   Preferred imaging location?    Answer:    [facility]   COMPLETE METABOLIC PANEL WITH GFR   CBC with Differential/Platelet   Lipid panel    Order Specific Question:   Has the patient fasted?    Answer:   Yes   Hemoglobin A1c   TSH     No orders of the defined types were placed in  this encounter.   Follow up plan: Return in about 1 year (around 09/08/2023) for 1 year Annual Physical AM apt fasting lab AFTER.  Nobie Putnam, [facility] 09/07/2022, 9:32 AM

## 2022-09-07 NOTE — Patient Instructions (Addendum)
Thank you for coming to the office today.  Stay tuned for lab results.  We will request copy of last pap smear.  For Mammogram screening for breast cancer   Call the [place] below anytime to schedule your own appointment now that order has been placed.  [facility] [place], [address] [place], [place] [address] Phone: [phone]   Please schedule a Follow-up Appointment to: Return in about 1 year (around 09/08/2023) for 1 year Annual Physical AM apt fasting lab AFTER.  If you have any other questions or concerns, please feel free to call the office or send a message through [place]. You may also schedule an earlier appointment if necessary.  Additionally, you may be receiving a survey about your experience at our office within a few days to 1 week by e-mail or mail. We value your feedback.  Nobie Putnam, DO [place]

## 2022-09-08 LAB — CBC WITH DIFFERENTIAL/PLATELET
Absolute Monocytes: 488 cells/uL (ref 200–950)
Basophils Absolute: 117 cells/uL (ref 0–200)
Basophils Relative: 1.8 %
Eosinophils Absolute: 585 cells/uL — ABNORMAL HIGH (ref 15–500)
Eosinophils Relative: 9 %
HCT: 39.9 % (ref 35.0–45.0)
Hemoglobin: 13.6 g/dL (ref 11.7–15.5)
Lymphs Abs: 1580 cells/uL (ref 850–3900)
MCH: 30.1 pg (ref 27.0–33.0)
MCHC: 34.1 g/dL (ref 32.0–36.0)
MCV: 88.3 fL (ref 80.0–100.0)
MPV: 9.9 fL (ref 7.5–12.5)
Monocytes Relative: 7.5 %
Neutro Abs: 3731 cells/uL (ref 1500–7800)
Neutrophils Relative %: 57.4 %
Platelets: 303 10*3/uL (ref 140–400)
RBC: 4.52 10*6/uL (ref 3.80–5.10)
RDW: 12.8 % (ref 11.0–15.0)
Total Lymphocyte: 24.3 %
WBC: 6.5 10*3/uL (ref 3.8–10.8)

## 2022-09-08 LAB — HEMOGLOBIN A1C
Hgb A1c MFr Bld: 5.4 % of total Hgb (ref <5.7)
Mean Plasma Glucose: 108 mg/dL
eAG (mmol/L): 6 mmol/L

## 2022-09-08 LAB — COMPLETE METABOLIC PANEL WITH GFR
AG Ratio: 1.6 (calc) (ref 1.0–2.5)
ALT: 12 U/L (ref 6–29)
AST: 13 U/L (ref 10–35)
Albumin: 4.1 g/dL (ref 3.6–5.1)
Alkaline phosphatase (APISO): 62 U/L (ref 31–125)
BUN: 11 mg/dL (ref 7–25)
CO2: 25 mmol/L (ref 20–32)
Calcium: 9 mg/dL (ref 8.6–10.2)
Chloride: 106 mmol/L (ref 98–110)
Creat: 0.7 mg/dL (ref 0.50–0.99)
Globulin: 2.5 g/dL (calc) (ref 1.9–3.7)
Glucose, Bld: 82 mg/dL (ref 65–99)
Potassium: 4.1 mmol/L (ref 3.5–5.3)
Sodium: 140 mmol/L (ref 135–146)
Total Bilirubin: 0.6 mg/dL (ref 0.2–1.2)
Total Protein: 6.6 g/dL (ref 6.1–8.1)
eGFR: 108 mL/min/{1.73_m2} (ref >=60)

## 2022-09-08 LAB — LIPID PANEL
Cholesterol: 192 mg/dL (ref <200)
HDL: 76 mg/dL (ref >=50)
LDL Cholesterol (Calc): 102 mg/dL (calc) — ABNORMAL HIGH
Non-HDL Cholesterol (Calc): 116 mg/dL (calc) (ref <130)
Total CHOL/HDL Ratio: 2.5 (calc) (ref <5.0)
Triglycerides: 57 mg/dL (ref <150)

## 2022-09-08 LAB — TSH: TSH: 1.38 mIU/L

## 2022-09-08 NOTE — Telephone Encounter (Signed)
Requested Prescriptions  Pending Prescriptions Disp Refills  . ipratropium (ATROVENT) 0.06 % nasal spray [Pharmacy Med Name: IPRATROPIUM 0.06% SPRAY]      Sig: PLACE 2 SPRAYS INTO BOTH NOSTRILS 4 (FOUR) TIMES DAILY. FOR UP TO 5-7 DAYS THEN STOP.     Off-Protocol Failed - 09/07/2022  5:24 PM      Failed - Medication not assigned to a protocol, review manually.      Passed - Valid encounter within last 12 months    Recent Outpatient Visits          Yesterday Annual physical exam   [place], DO   3 months ago Eustachian tube dysfunction, right   [place], DO   4 months ago Elevated BP without diagnosis of hypertension   [facility] Olin Hauser, DO   9 months ago COVID-19 virus infection   [place], DO   11 months ago Influenza   [place], DO      Future Appointments            In 1 year [place], Devonne Doughty, DO [facility], [facility]          Off-Protocol Failed - 09/07/2022  5:24 PM      Failed - Medication not assigned to a protocol, review manually.      Passed - Valid encounter within last 12 months    Recent Outpatient Visits          Yesterday Annual physical exam   [place], DO   3 months ago Eustachian tube dysfunction, right   [place], DO   4 months ago Elevated BP without diagnosis of hypertension   [place], DO   9 months ago COVID-19 virus infection   [facility] Olin Hauser, DO   11 months ago Influenza   [place], Devonne Doughty, DO      Future Appointments            In 1 year [place], Devonne Doughty, DO [facility], [facility]

## 2022-10-22 ENCOUNTER — Other Ambulatory Visit: Payer: Self-pay | Admitting: Family Medicine

## 2022-10-22 DIAGNOSIS — J4520 Mild intermittent asthma, uncomplicated: Secondary | ICD-10-CM

## 2022-10-22 NOTE — Telephone Encounter (Signed)
Requested Prescriptions  Pending Prescriptions Disp Refills   ADVAIR DISKUS 100-50 MCG/ACT AEPB [Pharmacy Med Name: ADVAIR 100-50 DISKUS] 180 each 3    Sig: TAKE 1 PUFF BY MOUTH TWICE A DAY     Pulmonology:  Combination Products Passed - 10/22/2022  2:47 AM      Passed - Valid encounter within last 12 months    Recent Outpatient Visits           1 month ago Annual physical exam   [facility] [place], [name], DO   5 months ago Eustachian tube dysfunction, right   [facility] [place], [name], DO   6 months ago Elevated BP without diagnosis of hypertension   [facility] Smitty Cords, DO   10 months ago COVID-19 virus infection   [facility] [place], [name], DO   1 year ago Influenza   [facility] Althea Charon, [name], DO       Future Appointments             In 10 months Althea Charon, [name], DO [facility], [name]

## 2022-10-25 ENCOUNTER — Other Ambulatory Visit: Payer: Self-pay | Admitting: Family Medicine

## 2022-10-25 DIAGNOSIS — J4520 Mild intermittent asthma, uncomplicated: Secondary | ICD-10-CM

## 2022-10-26 NOTE — Telephone Encounter (Signed)
Requested Prescriptions  Pending Prescriptions Disp Refills   levalbuterol (XOPENEX HFA) 45 MCG/ACT inhaler [Pharmacy Med Name: LEVALBUTEROL TAR HFA INH] 15 each 0    Sig: INHALE 1 TO 2 PUFFS BY MOUTH EVERY 6 HOURS AS NEEDED FOR WHEEZE     Pulmonology:  Beta Agonists 2 Passed - 10/25/2022  8:49 AM      Passed - Last BP in normal range    BP Readings from Last 1 Encounters:  09/07/22 110/68         Passed - Last Heart Rate in normal range    Pulse Readings from Last 1 Encounters:  09/07/22 68         Passed - Valid encounter within last 12 months    Recent Outpatient Visits           1 month ago Annual physical exam   [facility] [place], [name], DO   5 months ago Eustachian tube dysfunction, right   [facility] [place], [name], DO   6 months ago Elevated BP without diagnosis of hypertension   [facility] Smitty Cords, [place]   11 months ago COVID-19 virus infection   [facility] [place], [name], DO   1 year ago Influenza   [facility] Althea Charon, [name], DO       Future Appointments             In 10 months Althea Charon, [name], DO [facility], [facility]

## 2022-11-14 ENCOUNTER — Other Ambulatory Visit: Payer: Self-pay | Admitting: Family Medicine

## 2022-11-14 DIAGNOSIS — J4520 Mild intermittent asthma, uncomplicated: Secondary | ICD-10-CM

## 2022-12-09 ENCOUNTER — Other Ambulatory Visit: Payer: Self-pay | Admitting: Family Medicine

## 2022-12-09 DIAGNOSIS — J4520 Mild intermittent asthma, uncomplicated: Secondary | ICD-10-CM

## 2022-12-09 NOTE — Telephone Encounter (Signed)
Requested Prescriptions  Pending Prescriptions Disp Refills   levalbuterol (XOPENEX HFA) 45 MCG/ACT inhaler [Pharmacy Med Name: LEVALBUTEROL TAR HFA 45MCG INH] 15 each 0    Sig: INHALE 1 TO 2 PUFFS BY MOUTH EVERY 6 HOURS AS NEEDED FOR WHEEZE     Pulmonology:  Beta Agonists 2 Passed - 12/09/2022  1:24 AM      Passed - Last BP in normal range    BP Readings from Last 1 Encounters:  09/07/22 110/68         Passed - Last Heart Rate in normal range    Pulse Readings from Last 1 Encounters:  09/07/22 68         Passed - Valid encounter within last 12 months    Recent Outpatient Visits           3 months ago Annual physical exam   [place], DO   6 months ago Eustachian tube dysfunction, right   [place], DO   7 months ago Elevated BP without diagnosis of hypertension   [facility] Olin Hauser, DO   1 year ago COVID-19 virus infection   [place], Devonne Doughty, DO   1 year ago Influenza   [place], DO       Future Appointments             In 9 months [place], Devonne Doughty, DO [facility], [facility]

## 2022-12-12 ENCOUNTER — Other Ambulatory Visit: Payer: Self-pay | Admitting: Family Medicine

## 2022-12-12 DIAGNOSIS — F419 Anxiety disorder, unspecified: Secondary | ICD-10-CM

## 2022-12-14 NOTE — Telephone Encounter (Signed)
Requested Prescriptions  Pending Prescriptions Disp Refills   hydrOXYzine (ATARAX) 25 MG tablet [Pharmacy Med Name: HYDROXYZINE HCL 25 MG TABLET] 180 tablet 2    Sig: TAKE 1/2 TO 1 TABLETS (12.5-25 MG TOTAL) BY MOUTH 2 (TWO) TIMES DAILY AS NEEDED FOR ANXIETY.     Ear, Nose, and Throat:  Antihistamines 2 Passed - 12/12/2022  8:25 AM      Passed - Cr in normal range and within 360 days    Creat  Date Value Ref Range Status  09/07/2022 0.70 0.50 - 0.99 mg/dL Final         Passed - Valid encounter within last 12 months    Recent Outpatient Visits           3 months ago Annual physical exam   [place], DO   7 months ago Eustachian tube dysfunction, right   [place], DO   7 months ago Elevated BP without diagnosis of hypertension   [facility] Olin Hauser, DO   1 year ago COVID-19 virus infection   [place], DO   1 year ago [place], DO       Future Appointments             In 9 months [place], Devonne Doughty, DO [facility], [facility]

## 2022-12-30 ENCOUNTER — Other Ambulatory Visit: Payer: Self-pay | Admitting: Family Medicine

## 2022-12-30 DIAGNOSIS — J4520 Mild intermittent asthma, uncomplicated: Secondary | ICD-10-CM

## 2022-12-30 NOTE — Telephone Encounter (Signed)
Future visit in 8 months . Requested Prescriptions  Pending Prescriptions Disp Refills   levalbuterol (XOPENEX HFA) 45 MCG/ACT inhaler [Pharmacy Med Name: LEVALBUTEROL TAR HFA 45MCG INH] 15 each 0    Sig: INHALE 1 TO 2 PUFFS BY MOUTH EVERY 6 HOURS AS NEEDED FOR WHEEZE     Pulmonology:  Beta Agonists 2 Passed - 12/30/2022  4:45 AM      Passed - Last BP in normal range    BP Readings from Last 1 Encounters:  09/07/22 110/68         Passed - Last Heart Rate in normal range    Pulse Readings from Last 1 Encounters:  09/07/22 68         Passed - Valid encounter within last 12 months    Recent Outpatient Visits           3 months ago Annual physical exam   [facility] [place], Devonne Doughty, DO   7 months ago Eustachian tube dysfunction, right   [place], DO   8 months ago Elevated BP without diagnosis of hypertension   [place], DO   1 year ago COVID-19 virus infection   [place], DO   1 year ago Influenza   [place], DO       Future Appointments             In 8 months [place], Devonne Doughty, DO [facility], [facility]

## 2023-01-20 ENCOUNTER — Other Ambulatory Visit: Payer: Self-pay | Admitting: Family Medicine

## 2023-01-20 DIAGNOSIS — J4520 Mild intermittent asthma, uncomplicated: Secondary | ICD-10-CM

## 2023-01-20 NOTE — Telephone Encounter (Signed)
Requested medication (s) are due for refill today:  Requested medication (s) are on the active medication list: Yes  Last refill:  12/30/22  Future visit scheduled:   Notes to clinic:  See request.    Requested Prescriptions  Pending Prescriptions Disp Refills   levalbuterol (XOPENEX HFA) 45 MCG/ACT inhaler [Pharmacy Med Name: LEVALBUTEROL TAR HFA 45MCG INH] 15 each 0    Sig: INHALE 1 TO 2 PUFFS BY MOUTH EVERY 6 HOURS AS NEEDED FOR WHEEZE     Pulmonology:  Beta Agonists 2 Passed - 01/20/2023  2:43 AM      Passed - Last BP in normal range    BP Readings from Last 1 Encounters:  09/07/22 110/68         Passed - Last Heart Rate in normal range    Pulse Readings from Last 1 Encounters:  09/07/22 68         Passed - Valid encounter within last 12 months    Recent Outpatient Visits           4 months ago Annual physical exam   [facility] [place], Devonne Doughty, DO   8 months ago Eustachian tube dysfunction, right   [place], DO   9 months ago Elevated BP without diagnosis of hypertension   [place], DO   1 year ago COVID-19 virus infection   [place], DO   1 year ago Influenza   [place], DO       Future Appointments             In 7 months [place], Devonne Doughty, DO [facility], [facility]

## 2023-04-09 ENCOUNTER — Other Ambulatory Visit: Payer: Self-pay | Admitting: Family Medicine

## 2023-04-09 DIAGNOSIS — J4520 Mild intermittent asthma, uncomplicated: Secondary | ICD-10-CM

## 2023-04-09 NOTE — Telephone Encounter (Signed)
Requested Prescriptions  Pending Prescriptions Disp Refills   levalbuterol (XOPENEX HFA) 45 MCG/ACT inhaler [Pharmacy Med Name: LEVALBUTEROL HFA INH (200PF) 15GM] 15 g 2    Sig: INHALE 1-2 PUFFS BY MOUTH EVERY 6 HOURS AS NEEDED FOR WHEEZING     Pulmonology:  Beta Agonists 2 Passed - 04/09/2023  1:10 PM      Passed - Last BP in normal range    BP Readings from Last 1 Encounters:  09/07/22 110/68         Passed - Last Heart Rate in normal range    Pulse Readings from Last 1 Encounters:  09/07/22 68         Passed - Valid encounter within last 12 months    Recent Outpatient Visits           7 months ago Annual physical exam   [place] [facility] [place], [name], DO   10 months ago Eustachian tube dysfunction, right   [facility] Smitty Cords, DO   11 months ago Elevated BP without diagnosis of hypertension   [facility] [facility] Smitty Cords, DO   1 year ago COVID-19 virus infection   [place] [place], [name], DO   1 year ago Influenza   [facility] Smitty Cords, DO       Future Appointments             In 5 months Althea Charon, [name], DO [place] [facility], [facility]

## 2023-04-20 LAB — HM MAMMOGRAPHY

## 2023-04-26 LAB — HM MAMMOGRAPHY

## 2023-06-09 ENCOUNTER — Other Ambulatory Visit: Payer: Self-pay | Admitting: Family Medicine

## 2023-06-09 DIAGNOSIS — J4520 Mild intermittent asthma, uncomplicated: Secondary | ICD-10-CM

## 2023-06-09 NOTE — Telephone Encounter (Signed)
Requested Prescriptions  Pending Prescriptions Disp Refills   levalbuterol (XOPENEX HFA) 45 MCG/ACT inhaler [Pharmacy Med Name: LEVALBUTEROL HFA INH (200PF) 15GM] 15 g 2    Sig: INHALE 1 TO 2 PUFFS BY MOUTH EVERY 6 HOURS AS NEEDED FOR WHEEZING     Pulmonology:  Beta Agonists 2 Passed - 06/09/2023  8:09 AM      Passed - Last BP in normal range    BP Readings from Last 1 Encounters:  09/07/22 110/68         Passed - Last Heart Rate in normal range    Pulse Readings from Last 1 Encounters:  09/07/22 68         Passed - Valid encounter within last 12 months    Recent Outpatient Visits           9 months ago Annual physical exam   [place] [facility] [place], [name], DO   1 year ago Eustachian tube dysfunction, right   [facility] [place], [name], DO   1 year ago Elevated BP without diagnosis of hypertension   [facility] Smitty Cords, DO   1 year ago COVID-19 virus infection   [place] [facility] [place], [name], DO   1 year ago Influenza   [facility] Smitty Cords, DO       Future Appointments             In 3 months Althea Charon, [name], DO [place] [facility], [facility]

## 2023-08-17 ENCOUNTER — Telehealth: Payer: Self-pay

## 2023-08-17 NOTE — Telephone Encounter (Signed)
Her apt is 9am  She can do labs a few days to a week before the apt, or the morning of, or AFTER all options are fine.  She just needs to let us know now or in advance if she needs labs done before the apt, so we can place orders and schedule her for lab visit.  If she prefers fasting labs done AFTER the apt, we do not need any more input, I can order them at the time of her visit.  Let me know what she says and I can place there orders if she is scheduled for lab apt.  Saralyn Pilar, DO [facility] 08/17/2023, 12:46 PM

## 2023-08-17 NOTE — Telephone Encounter (Signed)
Copied from CRM [id number]. Topic: Appointment Scheduling - Scheduling Inquiry for Clinic >> Aug 17, 2023  8:37 AM Marlow Baars wrote: Reason for CRM: The patient called wanting to know if she is supposed to get labs done prior to her appt on 10/21 for her physical. She wants to know if she should just come in early or get it done after. Please assist patient further.

## 2023-08-18 NOTE — Telephone Encounter (Signed)
Left message advising patient to call and let us know when she would like to have labs.

## 2023-09-13 ENCOUNTER — Encounter: Payer: Self-pay | Admitting: Family Medicine

## 2023-09-13 ENCOUNTER — Ambulatory Visit (INDEPENDENT_AMBULATORY_CARE_PROVIDER_SITE_OTHER): Payer: Commercial Managed Care - PPO | Admitting: Family Medicine

## 2023-09-13 VITALS — BP 122/76 | HR 95 | Ht 60.25 in | Wt 142.0 lb

## 2023-09-13 DIAGNOSIS — Z131 Encounter for screening for diabetes mellitus: Secondary | ICD-10-CM | POA: Diagnosis not present

## 2023-09-13 DIAGNOSIS — Z0000 Encounter for general adult medical examination without abnormal findings: Secondary | ICD-10-CM

## 2023-09-13 DIAGNOSIS — J4520 Mild intermittent asthma, uncomplicated: Secondary | ICD-10-CM

## 2023-09-13 DIAGNOSIS — Z23 Encounter for immunization: Secondary | ICD-10-CM | POA: Diagnosis not present

## 2023-09-13 DIAGNOSIS — E7800 Pure hypercholesterolemia, unspecified: Secondary | ICD-10-CM | POA: Diagnosis not present

## 2023-09-13 MED ORDER — WIXELA INHUB 100-50 MCG/ACT IN AEPB: 1.0000 | INHALATION_SPRAY | Freq: Two times a day (BID) | RESPIRATORY_TRACT | 3 refills | Status: DC

## 2023-09-13 NOTE — Progress Notes (Signed)
Subjective:    Patient ID: Maureen Allen, female    DOB: Apr 03, 1976, 47 y.o.   MRN: [id number]  Maureen Allen is a 47 y.o. female presenting on 09/13/2023 for Annual Exam   HPI  Here for Annual physical and Lab orders.  Discussed the use of AI scribe software for clinical note transcription with the patient, who gave verbal consent to proceed.          Asthma, mild intermittent No flare up Over the past year, she reported no significant health issues. She has been managing her asthma with Advair and albuterol, both of which she requested to be refilled.    Lifestyle, BMI 27 Hyperlipidemia   She is doing well overall She is due for labs, fasting today   Exercising increasing Meals balanced more now   Recent history Eustachian tube dysfunction Also lower dental issue, needs root canal Admits both problems were contributing to her recent symptoms Now improved   Anxiety / Major Depression single episode in complete remission See prior for background. Off of medications. Continues to do well without problem   Insomnia continues Hydroxyzine, especially help sleep at night      Health Maintenance:   Flu Shot today   Colon CA Screening: Last Colonoscopy (done by  GI Dr Tobi Bastos), diagnostic due to change in stool caliber and back pain, results with normal colon without polyp or abnormality, good for 10 years. Currently asymptomatic. No known family history of colon CA.  - Next due colonoscopy age 26 - 10 years (approx 2030)   Due for pap smear, she will return to OBGYN in [place] at [facility] clinic, will send [place] copy of pap smear / request records   [facility] - She completed initial mammogram, and initial mammo screening was abnormal, and she had to do diagnostic mammogram at other facility.       09/13/2023    9:50 AM 09/07/2022    9:36 AM 05/15/2022    3:38 PM  Depression screen PHQ 2/9  Decreased Interest 0 0 0  Down, Depressed, Hopeless 0 0 0  PHQ -  2 Score 0 0 0  Altered sleeping 0 1 2  Tired, decreased energy 0 0 1  Change in appetite 0 0 0  Feeling bad or failure about yourself  0 0 1  Trouble concentrating 0 0 1  Moving slowly or fidgety/restless 0 0 0  Suicidal thoughts 0 0 0  PHQ-9 Score 0 1 5  Difficult doing work/chores Not difficult at all Not difficult at all Somewhat difficult      09/13/2023    9:50 AM 09/07/2022    9:36 AM 05/15/2022    3:39 PM 04/23/2022   11:23 AM  GAD 7 : Generalized Anxiety Score  Nervous, Anxious, on Edge 0 0 2 2  Control/stop worrying 0 0 2 2  Worry too much - different things 0 0 3 3  Trouble relaxing 0 0 2 2  Restless 0 0 2 2  Easily annoyed or irritable 0 0 1 1  Afraid - awful might happen 0 0 1 1  Total GAD 7 Score 0 0 13 13  Anxiety Difficulty Not difficult at all Not difficult at all Not difficult at all Not difficult at all      Past Medical History:  Diagnosis Date   Allergy    oak leaves fall, blooming   Anxiety    occasionally has panic attack due to large extra stressor  Asthma    Pre-eclampsia    Past Surgical History:  Procedure Laterality Date   COLONOSCOPY WITH PROPOFOL N/A 12/23/2018   Procedure: COLONOSCOPY WITH PROPOFOL;  Surgeon: Wyline Mood, MD;  Location: [facility] ENDOSCOPY;  Service: Gastroenterology;  Laterality: N/A;   MOUTH SURGERY  1993   Social History   Socioeconomic History   Marital status: Married    Spouse name: Jashauna Peller   Number of children: Not on file   Years of education: Not on file   Highest education level: Not on file  Occupational History   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  Tobacco Use   Smoking status: Former    Current packs/day: 0.00    Types: Cigarettes    Quit date: 11/24/1999    Years since quitting: 23.8   Smokeless tobacco: Never  Vaping Use   Vaping status: Never Used  Substance and Sexual Activity   Alcohol use: Yes    Alcohol/week: 1.0 standard drink of alcohol    Types: 1 Standard drinks or equivalent per week    Drug use: No   Sexual activity: Yes    Birth control/protection: Surgical    Comment: partner vasectomy  Other Topics Concern   Not on file  Social History Narrative   Not on file   Social Determinants of Health   Financial Resource Strain: Not on file  Food Insecurity: Not on file  Transportation Needs: Not on file  Physical Activity: Not on file  Stress: Not on file  Social Connections: Not on file  Intimate Partner Violence: Not on file   Family History  Adopted: Yes  Problem Relation Age of Onset   Healthy Son    Current Outpatient Medications on File Prior to Visit  Medication Sig   fluticasone (FLONASE) 50 MCG/ACT nasal spray PLACE 2 SPRAYS INTO BOTH NOSTRILS DAILY. USE FOR 4-6 WEEKS THEN STOP AND USE SEASONALLY OR AS NEEDED   hydrOXYzine (ATARAX) 25 MG tablet TAKE 1/2 TO 1 TABLETS (12.5-25 MG TOTAL) BY MOUTH 2 (TWO) TIMES DAILY AS NEEDED FOR ANXIETY.   Ibuprofen (ADVIL PO) Take by mouth as needed.   levalbuterol (XOPENEX HFA) 45 MCG/ACT inhaler INHALE 1 TO 2 PUFFS BY MOUTH EVERY 6 HOURS AS NEEDED FOR WHEEZING   loratadine (CLARITIN) 10 MG tablet Take 10 mg by mouth daily.   No current facility-administered medications on file prior to visit.    Review of Systems  Constitutional:  Negative for activity change, appetite change, chills, diaphoresis, fatigue and fever.  HENT:  Negative for congestion and hearing loss.   Eyes:  Negative for visual disturbance.  Respiratory:  Negative for cough, chest tightness, shortness of breath and wheezing.   Cardiovascular:  Negative for chest pain, palpitations and leg swelling.  Gastrointestinal:  Negative for abdominal pain, constipation, diarrhea, nausea and vomiting.  Genitourinary:  Negative for dysuria, frequency and hematuria.  Musculoskeletal:  Negative for arthralgias and neck pain.  Skin:  Negative for rash.  Neurological:  Negative for dizziness, weakness, light-headedness, numbness and headaches.  Hematological:   Negative for adenopathy.  Psychiatric/Behavioral:  Negative for behavioral problems, dysphoric mood and sleep disturbance.    Per HPI unless specifically indicated above      Objective:    BP 122/76   Pulse 95   Ht 5' 0.25" (1.53 m)   Wt 142 lb (64.4 kg)   SpO2 98%   BMI 27.50 kg/m   Wt Readings from Last 3 Encounters:  09/13/23 142 lb (64.4 kg)  09/07/22  134 lb 12.8 oz (61.1 kg)  05/15/22 137 lb (62.1 kg)    Physical Exam Vitals and nursing note reviewed.  Constitutional:      General: She is not in acute distress.    Appearance: She is well-developed. She is not diaphoretic.     Comments: Well-appearing, comfortable, cooperative  HENT:     Head: Normocephalic and atraumatic.  Eyes:     General:        Right eye: No discharge.        Left eye: No discharge.     Conjunctiva/sclera: Conjunctivae normal.     Pupils: Pupils are equal, round, and reactive to light.  Neck:     Thyroid: No thyromegaly.  Cardiovascular:     Rate and Rhythm: Normal rate and regular rhythm.     Pulses: Normal pulses.     Heart sounds: Normal heart sounds. No murmur heard. Pulmonary:     Effort: Pulmonary effort is normal. No respiratory distress.     Breath sounds: Wheezing (Slight wheeze at end of exhilitation) present. No rales.  Abdominal:     General: Bowel sounds are normal. There is no distension.     Palpations: Abdomen is soft. There is no mass.     Tenderness: There is no abdominal tenderness.  Musculoskeletal:        General: No tenderness. Normal range of motion.     Cervical back: Normal range of motion and neck supple.     Comments: Upper / Lower Extremities: - Normal muscle tone, strength bilateral upper extremities 5/5, lower extremities 5/5  Lymphadenopathy:     Cervical: No cervical adenopathy.  Skin:    General: Skin is warm and dry.     Findings: No erythema or rash.  Neurological:     Mental Status: She is alert and oriented to person, place, and time.      Comments: Distal sensation intact to light touch all extremities  Psychiatric:        Mood and Affect: Mood normal.        Behavior: Behavior normal.        Thought Content: Thought content normal.     Comments: Well groomed, good eye contact, normal speech and thoughts        Results for orders placed or performed in visit on 09/07/22  COMPLETE METABOLIC PANEL WITH GFR  Result Value Ref Range   Glucose, Bld 82 65 - 99 mg/dL   BUN 11 7 - 25 mg/dL   Creat 1.61 0.96 - 0.45 mg/dL   eGFR 409 > OR = 60 WJ/XBJ/4.78G9   BUN/Creatinine Ratio SEE NOTE: 6 - 22 (calc)   Sodium 140 135 - 146 mmol/L   Potassium 4.1 3.5 - 5.3 mmol/L   Chloride 106 98 - 110 mmol/L   CO2 25 20 - 32 mmol/L   Calcium 9.0 8.6 - 10.2 mg/dL   Total Protein 6.6 6.1 - 8.1 g/dL   Albumin 4.1 3.6 - 5.1 g/dL   Globulin 2.5 1.9 - 3.7 g/dL (calc)   AG Ratio 1.6 1.0 - 2.5 (calc)   Total Bilirubin 0.6 0.2 - 1.2 mg/dL   Alkaline phosphatase (APISO) 62 31 - 125 U/L   AST 13 10 - 35 U/L   ALT 12 6 - 29 U/L  CBC with Differential/Platelet  Result Value Ref Range   WBC 6.5 3.8 - 10.8 Thousand/uL   RBC 4.52 3.80 - 5.10 Million/uL   Hemoglobin 13.6 11.7 - 15.5 g/dL  HCT 39.9 35.0 - 45.0 %   MCV 88.3 80.0 - 100.0 fL   MCH 30.1 27.0 - 33.0 pg   MCHC 34.1 32.0 - 36.0 g/dL   RDW 16.1 09.6 - 04.5 %   Platelets 303 140 - 400 Thousand/uL   MPV 9.9 7.5 - 12.5 fL   Neutro Abs 3,731 1,500 - 7,800 cells/uL   Lymphs Abs 1,580 850 - 3,900 cells/uL   Absolute Monocytes 488 200 - 950 cells/uL   Eosinophils Absolute 585 (H) 15 - 500 cells/uL   Basophils Absolute 117 0 - 200 cells/uL   Neutrophils Relative % 57.4 %   Total Lymphocyte 24.3 %   Monocytes Relative 7.5 %   Eosinophils Relative 9.0 %   Basophils Relative 1.8 %  Lipid panel  Result Value Ref Range   Cholesterol 192 <200 mg/dL   HDL 76 > OR = 50 mg/dL   Triglycerides 57 <409 mg/dL   LDL Cholesterol (Calc) 102 (H) mg/dL (calc)   Total CHOL/HDL Ratio 2.5 <5.0 (calc)    Non-HDL Cholesterol (Calc) 116 <130 mg/dL (calc)  Hemoglobin W1X  Result Value Ref Range   Hgb A1c MFr Bld 5.4 <5.7 % of total Hgb   Mean Plasma Glucose 108 mg/dL   eAG (mmol/L) 6.0 mmol/L  TSH  Result Value Ref Range   TSH 1.38 mIU/L      Assessment & Plan:   Problem List Items Addressed This Visit     Asthma   Relevant Medications   WIXELA INHUB 100-50 MCG/ACT AEPB   Pure hypercholesterolemia   Relevant Orders   Lipid panel   COMPLETE METABOLIC PANEL WITH GFR   CBC with Differential/Platelet   TSH   Other Visit Diagnoses     Annual physical exam    -  Primary   Relevant Orders   Hemoglobin A1c   Lipid panel   COMPLETE METABOLIC PANEL WITH GFR   CBC with Differential/Platelet   TSH   Needs flu shot       Relevant Orders   Flu vaccine trivalent PF, 6mos and older(Flulaval,Afluria,Fluarix,Fluzone)   Screening for diabetes mellitus (DM)       Relevant Orders   Hemoglobin A1c       Assessment and Plan    Asthma Slight wheeze noted on examination, but no reported flares or shortness of breath. -Continue Wixela brand maintenance inhaler one puff twice per day. -Continue Albuterol as needed. Unable to find albuterol powder option, she request to change due to environmental concern on aerosol  General Health Maintenance -Administer influenza vaccine today. -Order complete blood panel including chemistry, blood count, cholesterol, sugar, and thyroid. -Obtain official reports of Pap smear and mammogram results from [facility] and specialized mammogram place. -Continue monitoring cholesterol levels, which showed significant improvement last year. -Continue monitoring weight trend, which showed slight increase over the past year.  Follow-up -Return for blood work today. -Return with insurance paperwork for lab results. -Report any changes in asthma symptoms.        Meds ordered this encounter  Medications   WIXELA INHUB 100-50 MCG/ACT AEPB     Sig: Inhale 1 puff into the lungs 2 (two) times daily.    Dispense:  180 each    Refill:  3      Follow up plan: Return in about 1 year (around 09/12/2024) for 1 year Annual Physical AM apt fasting lab AFTER (Prefer Mon AM).  Saralyn Pilar, DO [facility]  09/13/2023, 9:32 AM

## 2023-09-13 NOTE — Patient Instructions (Addendum)
Thank you for coming to the office today.  Refilled daily inhaler  - Wixela  Labs today  Flu Shot today  Records request for the Mammogram / Previous pap smear   Please schedule a Follow-up Appointment to: Return in about 1 year (around 09/12/2024) for 1 year Annual Physical AM apt fasting lab AFTER (Prefer Mon AM).  If you have any other questions or concerns, please feel free to call the office or send a message through MyChart. You may also schedule an earlier appointment if necessary.  Additionally, you may be receiving a survey about your experience at our office within a few days to 1 week by e-mail or mail. We value your feedback.  Saralyn Pilar, DO [facility], [place]

## 2023-09-14 LAB — CBC WITH DIFFERENTIAL/PLATELET
Absolute Lymphocytes: 1879 {cells}/uL (ref 850–3900)
Absolute Monocytes: 439 {cells}/uL (ref 200–950)
Basophils Absolute: 101 {cells}/uL (ref 0–200)
Basophils Relative: 1.4 %
Eosinophils Absolute: 569 {cells}/uL — ABNORMAL HIGH (ref 15–500)
Eosinophils Relative: 7.9 %
HCT: 44.6 % (ref 35.0–45.0)
Hemoglobin: 14.6 g/dL (ref 11.7–15.5)
MCH: 30 pg (ref 27.0–33.0)
MCHC: 32.7 g/dL (ref 32.0–36.0)
MCV: 91.8 fL (ref 80.0–100.0)
MPV: 9.7 fL (ref 7.5–12.5)
Monocytes Relative: 6.1 %
Neutro Abs: 4212 {cells}/uL (ref 1500–7800)
Neutrophils Relative %: 58.5 %
Platelets: 306 10*3/uL (ref 140–400)
RBC: 4.86 10*6/uL (ref 3.80–5.10)
RDW: 12.4 % (ref 11.0–15.0)
Total Lymphocyte: 26.1 %
WBC: 7.2 10*3/uL (ref 3.8–10.8)

## 2023-09-14 LAB — LIPID PANEL
Cholesterol: 234 mg/dL — ABNORMAL HIGH (ref <200)
HDL: 73 mg/dL (ref >=50)
LDL Cholesterol (Calc): 139 mg/dL — ABNORMAL HIGH
Non-HDL Cholesterol (Calc): 161 mg/dL — ABNORMAL HIGH (ref <130)
Total CHOL/HDL Ratio: 3.2 (calc) (ref <5.0)
Triglycerides: 106 mg/dL (ref <150)

## 2023-09-14 LAB — COMPLETE METABOLIC PANEL WITH GFR
AG Ratio: 1.9 (calc) (ref 1.0–2.5)
ALT: 11 U/L (ref 6–29)
AST: 13 U/L (ref 10–35)
Albumin: 4.5 g/dL (ref 3.6–5.1)
Alkaline phosphatase (APISO): 62 U/L (ref 31–125)
BUN: 14 mg/dL (ref 7–25)
CO2: 26 mmol/L (ref 20–32)
Calcium: 9.5 mg/dL (ref 8.6–10.2)
Chloride: 103 mmol/L (ref 98–110)
Creat: 0.73 mg/dL (ref 0.50–0.99)
Globulin: 2.4 g/dL (ref 1.9–3.7)
Glucose, Bld: 87 mg/dL (ref 65–99)
Potassium: 3.9 mmol/L (ref 3.5–5.3)
Sodium: 137 mmol/L (ref 135–146)
Total Bilirubin: 0.6 mg/dL (ref 0.2–1.2)
Total Protein: 6.9 g/dL (ref 6.1–8.1)
eGFR: 102 mL/min/{1.73_m2} (ref >=60)

## 2023-09-14 LAB — HEMOGLOBIN A1C
Hgb A1c MFr Bld: 5.3 %{Hb} (ref <5.7)
Mean Plasma Glucose: 105 mg/dL
eAG (mmol/L): 5.8 mmol/L

## 2023-09-14 LAB — TSH: TSH: 1.05 m[IU]/L

## 2023-09-16 ENCOUNTER — Other Ambulatory Visit: Payer: Self-pay | Admitting: Family Medicine

## 2023-09-16 DIAGNOSIS — F419 Anxiety disorder, unspecified: Secondary | ICD-10-CM

## 2023-09-17 NOTE — Telephone Encounter (Signed)
Requested Prescriptions  Pending Prescriptions Disp Refills   hydrOXYzine (ATARAX) 25 MG tablet [Pharmacy Med Name: HYDROXYZINE HCL 25 MG TABLET] 180 tablet 2    Sig: TAKE 1/2 TO 1 TABLET BY MOUTH TWICE A DAY AS NEEDED FOR ANXIETY     Ear, Nose, and Throat:  Antihistamines 2 Passed - 09/16/2023  1:37 AM      Passed - Cr in normal range and within 360 days    Creat  Date Value Ref Range Status  09/13/2023 0.73 0.50 - 0.99 mg/dL Final         Passed - Valid encounter within last 12 months    Recent Outpatient Visits           4 days ago Annual physical exam   [facility] [facility] Smitty Cords, DO   1 year ago Annual physical exam   [place] [facility] Smitty Cords, DO   1 year ago Eustachian tube dysfunction, right   [facility], [name], DO   1 year ago Elevated BP without diagnosis of hypertension   [place] [facility] Smitty Cords, DO   1 year ago COVID-19 virus infection   [place] [facility] [place], [name], DO       Future Appointments             In 12 months Althea Charon, [name], DO  [facility], [facility]

## 2023-09-27 ENCOUNTER — Other Ambulatory Visit: Payer: Self-pay | Admitting: Family Medicine

## 2023-09-27 DIAGNOSIS — J4520 Mild intermittent asthma, uncomplicated: Secondary | ICD-10-CM

## 2023-09-28 NOTE — Telephone Encounter (Signed)
Requested Prescriptions  Pending Prescriptions Disp Refills   levalbuterol (XOPENEX HFA) 45 MCG/ACT inhaler [Pharmacy Med Name: LEVALBUTEROL HFA INH (200PF) 15GM] 15 g 2    Sig: INHALE 1 TO 2 PUFFS BY MOUTH EVERY 6 HOURS AS NEEDED FOR WHEEZING     Pulmonology:  Beta Agonists 2 Passed - 09/27/2023  8:09 AM      Passed - Last BP in normal range    BP Readings from Last 1 Encounters:  09/13/23 122/76         Passed - Last Heart Rate in normal range    Pulse Readings from Last 1 Encounters:  09/13/23 95         Passed - Valid encounter within last 12 months    Recent Outpatient Visits           2 weeks ago Annual physical exam   [place] [facility] Smitty Cords, DO   1 year ago Annual physical exam   [place] [facility] Smitty Cords, DO   1 year ago Eustachian tube dysfunction, right   [facility], [name], DO   1 year ago Elevated BP without diagnosis of hypertension   [place] [facility] Smitty Cords, DO   1 year ago COVID-19 virus infection   [place] [facility] [place], [name], DO       Future Appointments             In 11 months Althea Charon, [name], DO  [facility], [facility]

## 2023-10-14 ENCOUNTER — Encounter: Payer: Self-pay | Admitting: Family Medicine

## 2023-12-09 ENCOUNTER — Other Ambulatory Visit: Payer: Self-pay | Admitting: Family Medicine

## 2023-12-09 DIAGNOSIS — J4520 Mild intermittent asthma, uncomplicated: Secondary | ICD-10-CM

## 2023-12-09 NOTE — Telephone Encounter (Signed)
Requested Prescriptions  Pending Prescriptions Disp Refills   levalbuterol (XOPENEX HFA) 45 MCG/ACT inhaler [Pharmacy Med Name: LEVALBUTEROL HFA INH (200PF) 15GM] 15 g 0    Sig: INHALE 1 TO 2 PUFFS BY MOUTH EVERY 6 HOURS AS NEEDED FOR WHEEZING     Pulmonology:  Beta Agonists 2 Passed - 12/09/2023  3:26 PM      Passed - Last BP in normal range    BP Readings from Last 1 Encounters:  09/13/23 122/76         Passed - Last Heart Rate in normal range    Pulse Readings from Last 1 Encounters:  09/13/23 95         Passed - Valid encounter within last 12 months    Recent Outpatient Visits           2 months ago Annual physical exam   [place] [facility] Smitty Cords, DO   1 year ago Annual physical exam   [facility] Smitty Cords, DO   1 year ago Eustachian tube dysfunction, right   [facility], [name], DO   1 year ago Elevated BP without diagnosis of hypertension   [place] Smitty Cords, DO   2 years ago COVID-19 virus infection   [place] [facility] [place], [name], DO       Future Appointments             In 9 months Althea Charon, [name], DO [place] [facility], [facility]

## 2024-01-26 ENCOUNTER — Other Ambulatory Visit: Payer: Self-pay | Admitting: Family Medicine

## 2024-01-26 DIAGNOSIS — J4520 Mild intermittent asthma, uncomplicated: Secondary | ICD-10-CM

## 2024-01-27 NOTE — Telephone Encounter (Signed)
 Requested Prescriptions  Pending Prescriptions Disp Refills   levalbuterol (XOPENEX HFA) 45 MCG/ACT inhaler [Pharmacy Med Name: LEVALBUTEROL HFA INH (200PF) 15GM] 15 g 0    Sig: INHALE 1 TO 2 PUFFS BY MOUTH EVERY 6 HOURS AS NEEDED FOR WHEEZING     Pulmonology:  Beta Agonists 2 Passed - 01/27/2024  8:15 AM      Passed - Last BP in normal range    BP Readings from Last 1 Encounters:  09/13/23 122/76         Passed - Last Heart Rate in normal range    Pulse Readings from Last 1 Encounters:  09/13/23 95         Passed - Valid encounter within last 12 months    Recent Outpatient Visits           4 months ago Annual physical exam   [place] [facility] Smitty Cords, DO   1 year ago Annual physical exam   [place] [facility] Smitty Cords, DO   1 year ago Eustachian tube dysfunction, right   [facility] [place], [name], DO   1 year ago Elevated BP without diagnosis of hypertension   [place] [facility] Smitty Cords, DO   2 years ago COVID-19 virus infection   [place] [facility] [place], [name], DO       Future Appointments             In 7 months Althea Charon, [name], DO [facility] [address], [facility]

## 2024-02-25 ENCOUNTER — Other Ambulatory Visit: Payer: Self-pay | Admitting: Family Medicine

## 2024-02-25 DIAGNOSIS — J4520 Mild intermittent asthma, uncomplicated: Secondary | ICD-10-CM

## 2024-02-25 NOTE — Telephone Encounter (Signed)
 Requested Prescriptions  Pending Prescriptions Disp Refills   levalbuterol (XOPENEX HFA) 45 MCG/ACT inhaler [Pharmacy Med Name: LEVALBUTEROL HFA INH (200PF) 15GM] 15 g 0    Sig: INHALE 1 TO 2 PUFFS BY MOUTH EVERY 6 HOURS AS NEEDED FOR WHEEZING     Pulmonology:  Beta Agonists 2 Failed - 02/25/2024  4:16 PM      Failed - Valid encounter within last 12 months    Recent Outpatient Visits   None     Future Appointments             In 6 months Althea Charon, [name], DO [place] [facility], [facility]            Passed - Last BP in normal range    BP Readings from Last 1 Encounters:  09/13/23 122/76         Passed - Last Heart Rate in normal range    Pulse Readings from Last 1 Encounters:  09/13/23 95

## 2024-03-24 ENCOUNTER — Other Ambulatory Visit: Payer: Self-pay | Admitting: Family Medicine

## 2024-03-24 DIAGNOSIS — J4520 Mild intermittent asthma, uncomplicated: Secondary | ICD-10-CM

## 2024-03-24 NOTE — Telephone Encounter (Signed)
 Copied from CRM [id number]. Topic: Clinical - Medication Refill >> Mar 24, 2024  1:12 PM Georgeann Kindred wrote: Most Recent Primary Care Visit:  Provider: Raina Bunting  Department[id number]  Visit Type: PHYSICAL 20  Date: 09/13/2023  Medication: levalbuterol (XOPENEX HFA) 45 MCG/ACT inhaler  Has the patient contacted their pharmacy? Yes (Agent: If no, request that the patient contact the pharmacy for the refill. If patient does not wish to contact the pharmacy document the reason why and proceed with request.) (Agent: If yes, when and what did the pharmacy advise?)  Is this the correct pharmacy for this prescription? Yes If no, delete pharmacy and type the correct one.  This is the patient's preferred pharmacy:  [id number] - [place], [place] - [address] AT [facility] [address] [address] [place] [address] Phone: [phone] Fax: [phone]   Has the prescription been filled recently? No  Is the patient out of the medication? No 10 Puffs remaining  Has the patient been seen for an appointment in the last year OR does the patient have an upcoming appointment? Yes  Can we respond through MyChart? Yes  Agent: Please be advised that Rx refills may take up to 3 business days. We ask that you follow-up with your pharmacy.

## 2024-03-28 NOTE — Telephone Encounter (Signed)
 Medication recently refilled at correct pharmacy 02/25/2024 15 6 rf. Requested Prescriptions  Refused Prescriptions Disp Refills   levalbuterol (XOPENEX HFA) 45 MCG/ACT inhaler 15 g 6     Pulmonology:  Beta Agonists 2 Failed - 03/28/2024  7:47 AM      Failed - Valid encounter within last 12 months    Recent Outpatient Visits   None     Future Appointments             In 5 months [place], Kayleen Party, DO [place] [facility], [facility]            Passed - Last BP in normal range    BP Readings from Last 1 Encounters:  09/13/23 122/76         Passed - Last Heart Rate in normal range    Pulse Readings from Last 1 Encounters:  09/13/23 95

## 2024-08-01 LAB — LIPID PANEL
Cholesterol: 205 — AB (ref 0–200)
HDL: 66 (ref 35–70)
LDL Cholesterol: 123
Triglycerides: 74 (ref 40–160)

## 2024-08-01 LAB — BASIC METABOLIC PANEL WITH GFR: Glucose: 87

## 2024-08-09 ENCOUNTER — Other Ambulatory Visit: Payer: Self-pay | Admitting: Family Medicine

## 2024-08-09 DIAGNOSIS — J4520 Mild intermittent asthma, uncomplicated: Secondary | ICD-10-CM

## 2024-08-10 NOTE — Telephone Encounter (Signed)
 Requested Prescriptions  Pending Prescriptions Disp Refills   levalbuterol (XOPENEX HFA) 45 MCG/ACT inhaler [Pharmacy Med Name: LEVALBUTEROL HFA INH (200PF) 15GM] 15 g 0    Sig: INHALE 1 TO 2 PUFFS BY MOUTH EVERY 6 HOURS AS NEEDED FOR WHEEZING     Pulmonology:  Beta Agonists 2 Failed - 08/10/2024 12:26 PM      Failed - Valid encounter within last 12 months    Recent Outpatient Visits   None     Future Appointments             In 1 month Karamalegos, Marsa PARAS, DO [place] [facility], [address]            Passed - Last BP in normal range    BP Readings from Last 1 Encounters:  09/13/23 122/76         Passed - Last Heart Rate in normal range    Pulse Readings from Last 1 Encounters:  09/13/23 95

## 2024-09-11 ENCOUNTER — Encounter: Payer: Self-pay | Admitting: Family Medicine

## 2024-09-14 ENCOUNTER — Ambulatory Visit (INDEPENDENT_AMBULATORY_CARE_PROVIDER_SITE_OTHER): Admitting: Family Medicine

## 2024-09-14 VITALS — BP 124/80 | HR 89 | Ht 60.25 in | Wt 141.0 lb

## 2024-09-14 DIAGNOSIS — J4520 Mild intermittent asthma, uncomplicated: Secondary | ICD-10-CM | POA: Diagnosis not present

## 2024-09-14 DIAGNOSIS — Z23 Encounter for immunization: Secondary | ICD-10-CM | POA: Diagnosis not present

## 2024-09-14 DIAGNOSIS — N951 Menopausal and female climacteric states: Secondary | ICD-10-CM

## 2024-09-14 DIAGNOSIS — E7800 Pure hypercholesterolemia, unspecified: Secondary | ICD-10-CM | POA: Diagnosis not present

## 2024-09-14 DIAGNOSIS — Z0000 Encounter for general adult medical examination without abnormal findings: Secondary | ICD-10-CM | POA: Diagnosis not present

## 2024-09-14 MED ORDER — AIRSUPRA 90-80 MCG/ACT IN AERO: 2.0000 | INHALATION_SPRAY | RESPIRATORY_TRACT | 1 refills | Status: DC | PRN

## 2024-09-14 MED ORDER — WIXELA INHUB 100-50 MCG/ACT IN AEPB: 1.0000 | INHALATION_SPRAY | Freq: Two times a day (BID) | RESPIRATORY_TRACT | 3 refills | Status: AC

## 2024-09-14 NOTE — Patient Instructions (Addendum)
 Thank you for coming to the office today.  Request a copy of your childhood / college age vaccine record if you have it.  Or we can check the blood for the Hep B Titer check immunity  Or get the shot, 2 doses Hep B here or at pharmacy   FUTURE CONSIDERATION Coronary Calcium Score Cardiac CT Scan. This is a screening test for patients aged 48-50+ with cardiovascular risk factors or who are healthy but would be interested in Cardiovascular Screening for heart disease. Even if there is a family history of heart disease, this imaging can be useful. Typically it can be done every 5+ years or at a different timeline we agree on  The scan will look at the chest and mainly focus on the heart and identify early signs of calcium build up or blockages within the heart arteries. It is not 100% accurate for identifying blockages or heart disease, but it is useful to help us  predict who may have some early changes or be at risk in the future for a heart attack or cardiovascular problem.  The results are reviewed by a Cardiologist and they will document the results. It should become available on MyChart. Typically the results are divided into percentiles based on other patients of the same demographic and age. So it will compare your risk to others similar to you. If you have a higher score >99 or higher percentile >75%tile, it is recommended to consider Statin cholesterol therapy and or referral to Cardiologist. I will try to help explain your results and if we have questions we can contact the Cardiologist.  You will be contacted for scheduling. Usually it is done at any imaging facility through [facility], [facility] or [facility].  The cost is $99 flat fee total and it does not go through insurance, so no authorization is required.   Please schedule a Follow-up Appointment to: Return for 1 year Annual Physical, AM fasting labs AFTER.  If you have any other questions or  concerns, please feel free to call the office or send a message through MyChart. You may also schedule an earlier appointment if necessary.  Additionally, you may be receiving a survey about your experience at our office within a few days to 1 week by e-mail or mail. We value your feedback.  Marsa Officer, DO [facility], [place]

## 2024-09-14 NOTE — Progress Notes (Signed)
 Subjective:    Patient ID: Maureen Allen, female    DOB: 08/21/1976, 48 y.o.   MRN: [id number]  Maureen Allen is a 48 y.o. female presenting on 09/14/2024 for Annual Exam   HPI  Discussed the use of AI scribe software for clinical note transcription with the patient, who gave verbal consent to proceed.  History of Present Illness   Maureen Allen is a 48 year old female who presents for a routine check-up and flu vaccination.  Mild Intermittent Asthma - Asthma exacerbated by allergy seasons and wet weather - Increased dyspnea during wet and rainy conditions - Uses rescue inhaler as needed - Requests refill of rescue inhaler  Perimenopausal symptoms - Hot flashes, sleep disturbances, and joint pain Followed by GYN - Estradiol 1 mg and progesterone 200 mg prescribed - Improvement in symptoms, including mental focus and hip pain  Immunization considerations - History of chickenpox - Considering future vaccinations, including pneumonia and hepatitis B vaccines - Considering checking hepatitis B immunity status  Mood and stress - Mood, stress, and anxiety improved compared to the past    Insomnia continues Hydroxyzine , especially help sleep at night   Hip pain improved   Perimenopausal Last menstrual cycle 10/2023 She is on estrogen therapy Had vasomotor symptoms   Health Maintenance:   Flu Shot today   Colon CA Screening: Last Colonoscopy (done by [facility] Dr Therisa), diagnostic due to change in stool caliber and back pain, results with normal colon without polyp or abnormality, good for 10 years. Currently asymptomatic. No known family history of colon CA.  - Next due colonoscopy age 77 - 10 years (approx 2030)    [facility] Last Mammogram 2025, she will request copy or forward results to our office        09/14/2024    9:26 AM 09/13/2023    9:50 AM 09/07/2022    9:36 AM  Depression screen PHQ 2/9  Decreased Interest 0 0 0  Down, Depressed, Hopeless 0 0 0   PHQ - 2 Score 0 0 0  Altered sleeping 1 0 1  Tired, decreased energy 1 0 0  Change in appetite 0 0 0  Feeling bad or failure about yourself  0 0 0  Trouble concentrating 0 0 0  Moving slowly or fidgety/restless 1 0 0  Suicidal thoughts 0 0 0  PHQ-9 Score 3 0 1  Difficult doing work/chores Somewhat difficult Not difficult at all Not difficult at all       09/14/2024    9:26 AM 09/13/2023    9:50 AM 09/07/2022    9:36 AM 05/15/2022    3:39 PM  GAD 7 : Generalized Anxiety Score  Nervous, Anxious, on Edge 1 0 0 2  Control/stop worrying 0 0 0 2  Worry too much - different things 0 0 0 3  Trouble relaxing 0 0 0 2  Restless 0 0 0 2  Easily annoyed or irritable 0 0 0 1  Afraid - awful might happen 0 0 0 1  Total GAD 7 Score 1 0 0 13  Anxiety Difficulty Somewhat difficult Not difficult at all Not difficult at all Not difficult at all     Past Medical History:  Diagnosis Date   Allergy    oak leaves fall, blooming   Anxiety    occasionally has panic attack due to large extra stressor   Asthma    Pre-eclampsia    Past Surgical History:  Procedure Laterality Date  COLONOSCOPY WITH PROPOFOL  N/A 12/23/2018   Procedure: COLONOSCOPY WITH PROPOFOL ;  Surgeon: Therisa Bi, MD;  Location: [facility] ENDOSCOPY;  Service: Gastroenterology;  Laterality: N/A;   MOUTH SURGERY  1993   Social History   Socioeconomic History   Marital status: Married    Spouse name: Malayshia All   Number of children: Not on file   Years of education: Not on file   Highest education level: Bachelor's degree (e.g., BA, AB, BS)  Occupational History   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  Tobacco Use   Smoking status: Former    Current packs/day: 0.00    Types: Cigarettes    Quit date: 11/24/1999    Years since quitting: 24.8   Smokeless tobacco: Never  Vaping Use   Vaping status: Never Used  Substance and Sexual Activity   Alcohol use: Yes    Alcohol/week: 1.0 standard drink of alcohol    Types: 1 Standard  drinks or equivalent per week   Drug use: No   Sexual activity: Yes    Birth control/protection: Surgical    Comment: partner vasectomy  Other Topics Concern   Not on file  Social History Narrative   Not on file   Social Drivers of Health   Financial Resource Strain: Low Risk  (09/14/2024)   Overall Financial Resource Strain (CARDIA)    Difficulty of Paying Living Expenses: Not very hard  Food Insecurity: No Food Insecurity (09/14/2024)   Hunger Vital Sign    Worried About Running Out of Food in the Last Year: Never true    Ran Out of Food in the Last Year: Never true  Transportation Needs: No Transportation Needs (09/14/2024)   PRAPARE - Administrator, Civil Service (Medical): No    Lack of Transportation (Non-Medical): No  Physical Activity: Sufficiently Active (09/14/2024)   Exercise Vital Sign    Days of Exercise per Week: 6 days    Minutes of Exercise per Session: 50 min  Stress: No Stress Concern Present (09/14/2024)   [employer] of Occupational Health - Occupational Stress Questionnaire    Feeling of Stress: Only a little  Social Connections: Moderately Isolated (09/14/2024)   Social Connection and Isolation Panel    Frequency of Communication with Friends and Family: More than three times a week    Frequency of Social Gatherings with Friends and Family: Once a week    Attends Religious Services: Patient declined    Database administrator or Organizations: No    Attends Engineer, structural: Not on file    Marital Status: Married  Catering manager Violence: Not on file   Family History  Adopted: Yes  Problem Relation Age of Onset   Healthy Son    Current Outpatient Medications on File Prior to Visit  Medication Sig   estradiol (ESTRACE) 1 MG tablet Take 1 mg by mouth daily.   fluticasone  (FLONASE ) 50 MCG/ACT nasal spray PLACE 2 SPRAYS INTO BOTH NOSTRILS DAILY. USE FOR 4-6 WEEKS THEN STOP AND USE SEASONALLY OR AS NEEDED   Ibuprofen  (ADVIL PO) Take by mouth as needed.   loratadine (CLARITIN) 10 MG tablet Take 10 mg by mouth daily.   progesterone (PROMETRIUM) 200 MG capsule Take 200 mg by mouth daily.   No current facility-administered medications on file prior to visit.    Review of Systems  Constitutional:  Negative for activity change, appetite change, chills, diaphoresis, fatigue and fever.  HENT:  Negative for congestion and hearing loss.  Eyes:  Negative for visual disturbance.  Respiratory:  Negative for cough, chest tightness, shortness of breath and wheezing.   Cardiovascular:  Negative for chest pain, palpitations and leg swelling.  Gastrointestinal:  Negative for abdominal pain, constipation, diarrhea, nausea and vomiting.  Genitourinary:  Negative for dysuria, frequency and hematuria.  Musculoskeletal:  Negative for arthralgias and neck pain.  Skin:  Negative for rash.  Neurological:  Negative for dizziness, weakness, light-headedness, numbness and headaches.  Hematological:  Negative for adenopathy.  Psychiatric/Behavioral:  Negative for behavioral problems, dysphoric mood and sleep disturbance.    Per HPI unless specifically indicated above     Objective:    BP 124/80 (BP Location: Left Arm, Patient Position: Sitting, Cuff Size: Normal)   Pulse 89   Ht 5' 0.25 (1.53 m)   Wt 141 lb (64 kg)   SpO2 95%   BMI 27.31 kg/m   Wt Readings from Last 3 Encounters:  09/14/24 141 lb (64 kg)  09/13/23 142 lb (64.4 kg)  09/07/22 134 lb 12.8 oz (61.1 kg)    Physical Exam Vitals and nursing note reviewed.  Constitutional:      General: She is not in acute distress.    Appearance: She is well-developed. She is not diaphoretic.     Comments: Well-appearing, comfortable, cooperative  HENT:     Head: Normocephalic and atraumatic.     Right Ear: Tympanic membrane, ear canal and external ear normal. There is no impacted cerumen.     Left Ear: Tympanic membrane, ear canal and external ear normal. There is  no impacted cerumen.  Eyes:     General:        Right eye: No discharge.        Left eye: No discharge.     Conjunctiva/sclera: Conjunctivae normal.     Pupils: Pupils are equal, round, and reactive to light.  Neck:     Thyroid: No thyromegaly.     Vascular: No carotid bruit.  Cardiovascular:     Rate and Rhythm: Normal rate and regular rhythm.     Pulses: Normal pulses.     Heart sounds: Normal heart sounds. No murmur heard. Pulmonary:     Effort: Pulmonary effort is normal. No respiratory distress.     Breath sounds: Normal breath sounds. No wheezing or rales.  Abdominal:     General: Bowel sounds are normal. There is no distension.     Palpations: Abdomen is soft. There is no mass.     Tenderness: There is no abdominal tenderness.  Musculoskeletal:        General: No tenderness. Normal range of motion.     Cervical back: Normal range of motion and neck supple.     Comments: Upper / Lower Extremities: - Normal muscle tone, strength bilateral upper extremities 5/5, lower extremities 5/5  Lymphadenopathy:     Cervical: No cervical adenopathy.  Skin:    General: Skin is warm and dry.     Findings: No erythema or rash.  Neurological:     Mental Status: She is alert and oriented to person, place, and time.     Comments: Distal sensation intact to light touch all extremities  Psychiatric:        Mood and Affect: Mood normal.        Behavior: Behavior normal.        Thought Content: Thought content normal.     Comments: Well groomed, good eye contact, normal speech and thoughts  Results for orders placed or performed in visit on 09/14/24  Basic metabolic panel with GFR   Collection Time: 08/01/24 12:00 AM  Result Value Ref Range   Glucose 87   Lipid panel   Collection Time: 08/01/24 12:00 AM  Result Value Ref Range   Triglycerides 74 40 - 160   Cholesterol 205 (A) 0 - 200   HDL 66 35 - 70   LDL Cholesterol 123       Assessment & Plan:   Problem List Items  Addressed This Visit     Asthma   Relevant Medications   WIXELA INHUB  100-50 MCG/ACT AEPB   AIRSUPRA 90-80 MCG/ACT AERO   Pure hypercholesterolemia   Other Visit Diagnoses       Annual physical exam    -  Primary     Flu vaccine need       Relevant Orders   Flu vaccine trivalent PF, 6mos and older(Flulaval,Afluria,Fluarix,Fluzone) (Completed)     Perimenopausal            Updated Health Maintenance information Reviewed recent lab results with patient Encouraged improvement to lifestyle with diet and exercise Goal of weight loss  Asthma mild intermittent Asthma exacerbated by allergies and wet weather. Managed with albuterol  and Wixela. Rx AirSupra if covered $0 with copay card, up to 3 inhalers - Refilled Wixela (Advair).  Perimenopausal symptoms Followed by GYN Suspected near menopause w/ 10 month no cycle Symptoms include hot flashes, sleep disturbances, and joint pain. Managed with estradiol and progesterone, improving symptoms. On estradiol 1 mg. And Progesterone 200mg   Hyperlipidemia Cholesterol levels within acceptable range. Total cholesterol 205, triglycerides 74, HDL 66, LDL 123.  Adult Wellness Visit Routine visit. Discussed health status, including blood pressure, cholesterol, and glucose. Labs normal. - Scheduled next wellness visit for next year, same day as blood work.        Orders Placed This Encounter  Procedures   Flu vaccine trivalent PF, 6mos and older(Flulaval,Afluria,Fluarix,Fluzone)   Basic metabolic panel with GFR    This external order was created through the Results Console.   Lipid panel    This external order was created through the Results Console.    Meds ordered this encounter  Medications   WIXELA INHUB  100-50 MCG/ACT AEPB    Sig: Inhale 1 puff into the lungs 2 (two) times daily.    Dispense:  180 each    Refill:  3   AIRSUPRA 90-80 MCG/ACT AERO    Sig: Inhale 2 Inhalations into the lungs as needed (wheezing, shortness of  breath, cough).    Dispense:  32.1 g    Refill:  1    BIN [id number], PCN [id number], GRP [id number], ID [id number]     Follow up plan: Return for 1 year Annual Physical, AM fasting labs AFTER.  Marsa Officer, DO [facility] 09/14/2024, 9:21 AM

## 2024-10-02 ENCOUNTER — Encounter: Payer: Self-pay | Admitting: Family Medicine

## 2024-10-02 DIAGNOSIS — J4520 Mild intermittent asthma, uncomplicated: Secondary | ICD-10-CM

## 2024-10-02 MED ORDER — ALBUTEROL SULFATE HFA 108 (90 BASE) MCG/ACT IN AERS: 2.0000 | INHALATION_SPRAY | Freq: Four times a day (QID) | RESPIRATORY_TRACT | 3 refills | Status: AC | PRN

## 2025-09-18 ENCOUNTER — Encounter: Admitting: Family Medicine
